# Patient Record
Sex: Female | Born: 1980 | Race: White | Hispanic: No | Marital: Married | Smoking: Never smoker
Health system: Southern US, Community
[De-identification: ages and names within clinical notes are randomized; demographics above are authoritative.]

## PROBLEM LIST (undated history)

## (undated) DIAGNOSIS — D649 Anemia, unspecified: Secondary | ICD-10-CM

## (undated) DIAGNOSIS — M1612 Unilateral primary osteoarthritis, left hip: Secondary | ICD-10-CM

## (undated) DIAGNOSIS — R112 Nausea with vomiting, unspecified: Secondary | ICD-10-CM

## (undated) DIAGNOSIS — F419 Anxiety disorder, unspecified: Secondary | ICD-10-CM

## (undated) DIAGNOSIS — H8109 Meniere's disease, unspecified ear: Secondary | ICD-10-CM

## (undated) DIAGNOSIS — E282 Polycystic ovarian syndrome: Secondary | ICD-10-CM

## (undated) DIAGNOSIS — G43909 Migraine, unspecified, not intractable, without status migrainosus: Secondary | ICD-10-CM

## (undated) DIAGNOSIS — Z9889 Other specified postprocedural states: Secondary | ICD-10-CM

## (undated) DIAGNOSIS — D6851 Activated protein C resistance: Secondary | ICD-10-CM

## (undated) DIAGNOSIS — B029 Zoster without complications: Secondary | ICD-10-CM

## (undated) DIAGNOSIS — M479 Spondylosis, unspecified: Secondary | ICD-10-CM

## (undated) DIAGNOSIS — N809 Endometriosis, unspecified: Secondary | ICD-10-CM

## (undated) DIAGNOSIS — E069 Thyroiditis, unspecified: Secondary | ICD-10-CM

## (undated) HISTORY — DX: Endometriosis, unspecified: N80.9

## (undated) HISTORY — PX: NASAL SINUS SURGERY: SHX719

## (undated) HISTORY — PX: PELVIC LAPAROSCOPY: SHX162

## (undated) HISTORY — DX: Meniere's disease, unspecified ear: H81.09

## (undated) HISTORY — DX: Migraine, unspecified, not intractable, without status migrainosus: G43.909

## (undated) HISTORY — DX: Activated protein C resistance: D68.51

## (undated) HISTORY — DX: Polycystic ovarian syndrome: E28.2

## (undated) HISTORY — PX: SMALL INTESTINE SURGERY: SHX150

## (undated) HISTORY — DX: Anxiety disorder, unspecified: F41.9

---
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
## 2001-07-12 HISTORY — PX: NASAL SINUS SURGERY: SHX719

## 2006-07-12 HISTORY — PX: HERNIA REPAIR: SHX51

## 2006-07-12 HISTORY — PX: ABDOMINOPLASTY: SUR9

## 2012-07-12 DIAGNOSIS — I471 Supraventricular tachycardia, unspecified: Secondary | ICD-10-CM

## 2012-07-12 HISTORY — DX: Supraventricular tachycardia: I47.1

## 2012-07-12 HISTORY — DX: Supraventricular tachycardia, unspecified: I47.10

## 2012-07-12 HISTORY — PX: CARDIAC ELECTROPHYSIOLOGY STUDY AND ABLATION: SHX1294

## 2014-05-21 DIAGNOSIS — M5030 Other cervical disc degeneration, unspecified cervical region: Secondary | ICD-10-CM | POA: Insufficient documentation

## 2014-05-21 DIAGNOSIS — G43909 Migraine, unspecified, not intractable, without status migrainosus: Secondary | ICD-10-CM | POA: Insufficient documentation

## 2014-05-21 DIAGNOSIS — M4317 Spondylolisthesis, lumbosacral region: Secondary | ICD-10-CM | POA: Insufficient documentation

## 2014-06-17 DIAGNOSIS — G4700 Insomnia, unspecified: Secondary | ICD-10-CM | POA: Insufficient documentation

## 2014-06-17 DIAGNOSIS — R0683 Snoring: Secondary | ICD-10-CM | POA: Insufficient documentation

## 2016-07-12 HISTORY — PX: PELVIC LAPAROSCOPY: SHX162

## 2017-02-15 DIAGNOSIS — R42 Dizziness and giddiness: Secondary | ICD-10-CM | POA: Insufficient documentation

## 2017-02-15 DIAGNOSIS — H905 Unspecified sensorineural hearing loss: Secondary | ICD-10-CM | POA: Insufficient documentation

## 2017-02-15 DIAGNOSIS — H6981 Other specified disorders of Eustachian tube, right ear: Secondary | ICD-10-CM | POA: Insufficient documentation

## 2017-02-15 DIAGNOSIS — H8190 Unspecified disorder of vestibular function, unspecified ear: Secondary | ICD-10-CM | POA: Insufficient documentation

## 2017-02-15 DIAGNOSIS — H6991 Unspecified Eustachian tube disorder, right ear: Secondary | ICD-10-CM | POA: Insufficient documentation

## 2017-02-15 DIAGNOSIS — J3081 Allergic rhinitis due to animal (cat) (dog) hair and dander: Secondary | ICD-10-CM | POA: Insufficient documentation

## 2017-02-15 DIAGNOSIS — H903 Sensorineural hearing loss, bilateral: Secondary | ICD-10-CM | POA: Insufficient documentation

## 2017-06-14 DIAGNOSIS — H8101 Meniere's disease, right ear: Secondary | ICD-10-CM | POA: Insufficient documentation

## 2017-12-01 ENCOUNTER — Other Ambulatory Visit: Payer: Self-pay | Admitting: Sports Medicine

## 2017-12-01 DIAGNOSIS — M4317 Spondylolisthesis, lumbosacral region: Secondary | ICD-10-CM

## 2017-12-01 DIAGNOSIS — M79604 Pain in right leg: Secondary | ICD-10-CM

## 2017-12-01 DIAGNOSIS — M545 Low back pain: Secondary | ICD-10-CM

## 2017-12-01 DIAGNOSIS — M4727 Other spondylosis with radiculopathy, lumbosacral region: Secondary | ICD-10-CM

## 2017-12-14 ENCOUNTER — Ambulatory Visit: Payer: Self-pay

## 2017-12-23 ENCOUNTER — Ambulatory Visit
Admission: RE | Admit: 2017-12-23 | Discharge: 2017-12-23 | Disposition: A | Discharge status: Home / Self Care | Payer: 59 | Source: Ambulatory Visit | Attending: Sports Medicine | Admitting: Sports Medicine

## 2017-12-23 ENCOUNTER — Encounter: Payer: Self-pay | Admitting: Radiology

## 2017-12-23 DIAGNOSIS — M4807 Spinal stenosis, lumbosacral region: Secondary | ICD-10-CM | POA: Insufficient documentation

## 2017-12-23 DIAGNOSIS — M48061 Spinal stenosis, lumbar region without neurogenic claudication: Secondary | ICD-10-CM | POA: Diagnosis not present

## 2017-12-23 DIAGNOSIS — M4317 Spondylolisthesis, lumbosacral region: Secondary | ICD-10-CM | POA: Insufficient documentation

## 2017-12-23 DIAGNOSIS — M545 Low back pain: Secondary | ICD-10-CM | POA: Diagnosis present

## 2017-12-23 DIAGNOSIS — M4727 Other spondylosis with radiculopathy, lumbosacral region: Secondary | ICD-10-CM | POA: Diagnosis not present

## 2017-12-23 DIAGNOSIS — M5126 Other intervertebral disc displacement, lumbar region: Secondary | ICD-10-CM | POA: Insufficient documentation

## 2017-12-23 DIAGNOSIS — M5127 Other intervertebral disc displacement, lumbosacral region: Secondary | ICD-10-CM | POA: Insufficient documentation

## 2017-12-23 DIAGNOSIS — M79604 Pain in right leg: Secondary | ICD-10-CM

## 2018-01-10 ENCOUNTER — Encounter: Payer: Self-pay | Admitting: Certified Nurse Midwife

## 2018-01-10 ENCOUNTER — Ambulatory Visit (INDEPENDENT_AMBULATORY_CARE_PROVIDER_SITE_OTHER): Payer: Managed Care, Other (non HMO) | Admitting: Certified Nurse Midwife

## 2018-01-10 VITALS — BP 116/66 | HR 93 | Ht 71.0 in | Wt 204.5 lb

## 2018-01-10 DIAGNOSIS — Z30430 Encounter for insertion of intrauterine contraceptive device: Secondary | ICD-10-CM

## 2018-01-10 DIAGNOSIS — N809 Endometriosis, unspecified: Secondary | ICD-10-CM

## 2018-01-10 DIAGNOSIS — N921 Excessive and frequent menstruation with irregular cycle: Secondary | ICD-10-CM | POA: Diagnosis not present

## 2018-01-10 DIAGNOSIS — Z3202 Encounter for pregnancy test, result negative: Secondary | ICD-10-CM

## 2018-01-10 LAB — POCT URINE PREGNANCY: PREG TEST UR: NEGATIVE

## 2018-01-10 NOTE — Progress Notes (Signed)
New pt is here with issues with endometriosis. Mirena removed 1.5 years ago due to wanting to have another baby. They have decided to not try. Refused ibuprofen. Mirena IUD consent signed.

## 2018-01-10 NOTE — Patient Instructions (Addendum)
IUD PLACEMENT POST-PROCEDURE INSTRUCTIONS  1. You may take Ibuprofen, Aleve or Tylenol for pain if needed.  Cramping should resolve within in 24 hours.  2. You may have a small amount of spotting.  You should wear a mini pad for the next few days.  3. You may have intercourse after 72 hours.  If you using this for birth control, it is effective immediately.  4. You need to call if you have any pelvic pain, fever, heavy bleeding or foul smelling vaginal discharge.  Irregular bleeding is common the first several months after having an IUD placed. You do not need to call for this reason unless you are concerned.  5. Shower or bathe as normal  You should have a follow-up appointment in 4-8 weeks for a re-check to make sure you are not having any problems.  Levonorgestrel intrauterine device (IUD) What is this medicine? LEVONORGESTREL IUD (LEE voe nor jes trel) is a contraceptive (birth control) device. The device is placed inside the uterus by a healthcare professional. It is used to prevent pregnancy. This device can also be used to treat heavy bleeding that occurs during your period. This medicine may be used for other purposes; ask your health care provider or pharmacist if you have questions. COMMON BRAND NAME(S): Minette Headland What should I tell my health care provider before I take this medicine? They need to know if you have any of these conditions: -abnormal Pap smear -cancer of the breast, uterus, or cervix -diabetes -endometritis -genital or pelvic infection now or in the past -have more than one sexual partner or your partner has more than one partner -heart disease -history of an ectopic or tubal pregnancy -immune system problems -IUD in place -liver disease or tumor -problems with blood clots or take blood-thinners -seizures -use intravenous drugs -uterus of unusual shape -vaginal bleeding that has not been explained -an unusual or allergic reaction to  levonorgestrel, other hormones, silicone, or polyethylene, medicines, foods, dyes, or preservatives -pregnant or trying to get pregnant -breast-feeding How should I use this medicine? This device is placed inside the uterus by a health care professional. Talk to your pediatrician regarding the use of this medicine in children. Special care may be needed. Overdosage: If you think you have taken too much of this medicine contact a poison control center or emergency room at once. NOTE: This medicine is only for you. Do not share this medicine with others. What if I miss a dose? This does not apply. Depending on the brand of device you have inserted, the device will need to be replaced every 3 to 5 years if you wish to continue using this type of birth control. What may interact with this medicine? Do not take this medicine with any of the following medications: -amprenavir -bosentan -fosamprenavir This medicine may also interact with the following medications: -aprepitant -armodafinil -barbiturate medicines for inducing sleep or treating seizures -bexarotene -boceprevir -griseofulvin -medicines to treat seizures like carbamazepine, ethotoin, felbamate, oxcarbazepine, phenytoin, topiramate -modafinil -pioglitazone -rifabutin -rifampin -rifapentine -some medicines to treat HIV infection like atazanavir, efavirenz, indinavir, lopinavir, nelfinavir, tipranavir, ritonavir -St. John's wort -warfarin This list may not describe all possible interactions. Give your health care provider a list of all the medicines, herbs, non-prescription drugs, or dietary supplements you use. Also tell them if you smoke, drink alcohol, or use illegal drugs. Some items may interact with your medicine. What should I watch for while using this medicine? Visit your doctor or health care professional  for regular check ups. See your doctor if you or your partner has sexual contact with others, becomes HIV positive, or  gets a sexual transmitted disease. This product does not protect you against HIV infection (AIDS) or other sexually transmitted diseases. You can check the placement of the IUD yourself by reaching up to the top of your vagina with clean fingers to feel the threads. Do not pull on the threads. It is a good habit to check placement after each menstrual period. Call your doctor right away if you feel more of the IUD than just the threads or if you cannot feel the threads at all. The IUD may come out by itself. You may become pregnant if the device comes out. If you notice that the IUD has come out use a backup birth control method like condoms and call your health care provider. Using tampons will not change the position of the IUD and are okay to use during your period. This IUD can be safely scanned with magnetic resonance imaging (MRI) only under specific conditions. Before you have an MRI, tell your healthcare provider that you have an IUD in place, and which type of IUD you have in place. What side effects may I notice from receiving this medicine? Side effects that you should report to your doctor or health care professional as soon as possible: -allergic reactions like skin rash, itching or hives, swelling of the face, lips, or tongue -fever, flu-like symptoms -genital sores -high blood pressure -no menstrual period for 6 weeks during use -pain, swelling, warmth in the leg -pelvic pain or tenderness -severe or sudden headache -signs of pregnancy -stomach cramping -sudden shortness of breath -trouble with balance, talking, or walking -unusual vaginal bleeding, discharge -yellowing of the eyes or skin Side effects that usually do not require medical attention (report to your doctor or health care professional if they continue or are bothersome): -acne -breast pain -change in sex drive or performance -changes in weight -cramping, dizziness, or faintness while the device is being  inserted -headache -irregular menstrual bleeding within first 3 to 6 months of use -nausea This list may not describe all possible side effects. Call your doctor for medical advice about side effects. You may report side effects to FDA at 1-800-FDA-1088. Where should I keep my medicine? This does not apply. NOTE: This sheet is a summary. It may not cover all possible information. If you have questions about this medicine, talk to your doctor, pharmacist, or health care provider.  2018 Elsevier/Gold Standard (2016-04-09 14:14:56)  Endometriosis Endometriosis is a condition in which the tissue that lines the uterus (endometrium) grows outside of its normal location. The tissue may grow in many locations close to the uterus, but it commonly grows on the ovaries, fallopian tubes, vagina, or bowel. When the uterus sheds the endometrium every menstrual cycle, there is bleeding wherever the endometrial tissue is located. This can cause pain because blood is irritating to tissues that are not normally exposed to it. What are the causes? The cause of endometriosis is not known. What increases the risk? You may be more likely to develop endometriosis if you:  Have a family history of endometriosis.  Have never given birth.  Started your period at age 61 or younger.  Have high levels of estrogen in your body.  Were exposed to a certain medicine (diethylstilbestrol) before you were born (in utero).  Had low birth weight.  Were born as a twin, triplet, or other multiple.  Have  a BMI of less than 25. BMI is an estimate of body fat and is calculated from height and weight.  What are the signs or symptoms? Often, there are no symptoms of this condition. If you do have symptoms, they may:  Vary depending on where your endometrial tissue is growing.  Occur during your menstrual period (most common) or midcycle.  Come and go, or you may go months with no symptoms at all.  Stop with  menopause.  Symptoms may include:  Pain in the back or abdomen.  Heavier bleeding during periods.  Pain during sex.  Painful bowel movements.  Infertility.  Pelvic pain.  Bleeding more than once a month.  How is this diagnosed? This condition is diagnosed based on your symptoms and a physical exam. You may have tests, such as:  Blood tests and urine tests. These may be done to help rule out other possible causes of your symptoms.  Ultrasound, to look for abnormal tissues.  An X-ray of the lower bowel (barium enema).  An ultrasound that is done through the vagina (transvaginally).  CT scan.  MRI.  Laparoscopy. In this procedure, a lighted, pencil-sized instrument called a laparoscope is inserted into your abdomen through an incision. The laparoscope allows your health care provider to look at the organs inside your body and check for abnormal tissue to confirm the diagnosis. If abnormal tissue is found, your health care provider may remove a small piece of tissue (biopsy) to be examined under a microscope.  How is this treated? Treatment for this condition may include:  Medicines to relieve pain, such as NSAIDs.  Hormone therapy. This involves using artificial (synthetic) hormones to reduce endometrial tissue growth. Your health care provider may recommend using a hormonal form of birth control, or other medicines.  Surgery. This may be done to remove abnormal endometrial tissue. ? In some cases, tissue may be removed using a laparoscope and a laser (laparoscopic laser treatment). ? In severe cases, surgery may be done to remove the fallopian tubes, uterus, and ovaries (hysterectomy).  Follow these instructions at home:  Take over-the-counter and prescription medicines only as told by your health care provider.  Do not drive or use heavy machinery while taking prescription pain medicine.  Try to avoid activities that cause pain, including sexual activity.  Keep  all follow-up visits as told by your health care provider. This is important. Contact a health care provider if:  You have pain in the area between your hip bones (pelvic area) that occurs: ? Before, during, or after your period. ? In between your period and gets worse during your period. ? During or after sex. ? With bowel movements or urination, especially during your period.  You have problems getting pregnant.  You have a fever. Get help right away if:  You have severe pain that does not get better with medicine.  You have severe nausea and vomiting, or you cannot eat without vomiting.  You have pain that affects only the lower, right side of your abdomen.  You have abdominal pain that gets worse.  You have abdominal swelling.  You have blood in your stool. This information is not intended to replace advice given to you by your health care provider. Make sure you discuss any questions you have with your health care provider. Document Released: 06/25/2000 Document Revised: 04/02/2016 Document Reviewed: 11/29/2015 Elsevier Interactive Patient Education  [employer]2018 Elsevier Inc.

## 2018-01-10 NOTE — Progress Notes (Signed)
GYN ENCOUNTER NOTE  Subjective:       Amber Reed is a 37 y.o. G1P1 female here to discuss endometriosis treatment options. Symptoms previously managed with Mirena, but removed approximately one (1) year ago due to side effects (skin, hair, and mood changes) and decision to grow family.   At this time, the patient no longer desires any additional children and questions the option of hysterectomy in the future. States her mother had a hysterectomy in her 2640s.   Denies difficulty breathing or respiratory distress, chest pain, dysuria, and leg pain or swelling.    Gynecologic History  Patient's last menstrual period was 12/26/2017 (exact date).  Period Duration (Days): Seven (7) to 10 Period Pattern: (!) Irregular Menstrual Flow: Moderate Menstrual Control: Tampon, Maxi pad Menstrual Control Change Freq (Hours): Three (3) to four (4) Dysmenorrhea: (!) Severe Dysmenorrhea Symptoms: Cramping, Other (Comment)(Back pain)  Contraception: none  Last Pap: 2017. Results were: Negative/Negative   Obstetric History  OB History  Gravida Para Term Preterm AB Living  1 1          SAB TAB Ectopic Multiple Live Births          1    # Outcome Date GA Lbr Len/2nd Weight Sex Delivery Anes PTL Lv  1 Para             Past Medical History:  Diagnosis Date  . Anxiety   . Endometriosis   . Factor 5 Leiden mutation, heterozygous (HCC)   . Meniere disease   . Migraine   . PCO (polycystic ovaries)      Current Outpatient Medications on File Prior to Visit  Medication Sig Dispense Refill  . azelastine (ASTELIN) 0.1 % nasal spray Place into the nose.    Marland Kitchen. Betahistine HCl (BETAHISTINE DIHYDROCHLORIDE) POWD Use 24 mg once daily    . Cyanocobalamin-Methylcobalamin 600-600 MCG SUBL Place under the tongue.    . diclofenac (CATAFLAM) 50 MG tablet diclofenac potassium 50 mg tablet  TAKE 1 TABLET BY MOUTH EVERY 6 HOURS as needed    . gabapentin (NEURONTIN) 300 MG capsule Take by mouth.    .  ondansetron (ZOFRAN-ODT) 8 MG disintegrating tablet     . vitamin E 600 UNIT capsule Take by mouth.     No current facility-administered medications on file prior to visit.     Social History   Socioeconomic History  . Marital status: Married    Spouse name: Not on file  . Number of children: Not on file  . Years of education: Not on file  . Highest education level: Not on file  Occupational History  . Not on file  Social Needs  . Financial resource strain: Not on file  . Food insecurity:    Worry: Not on file    Inability: Not on file  . Transportation needs:    Medical: Not on file    Non-medical: Not on file  Tobacco Use  . Smoking status: Never Smoker  . Smokeless tobacco: Never Used  Substance and Sexual Activity  . Alcohol use: Not on file    Comment: ocass  . Drug use: Not Currently  . Sexual activity: Yes    Birth control/protection: None  Lifestyle  . Physical activity:    Days per week: Not on file    Minutes per session: Not on file  . Stress: Not on file  Relationships  . Social connections:    Talks on phone: Not on file  Gets together: Not on file    Attends religious service: Not on file    Active member of club or organization: Not on file    Attends meetings of clubs or organizations: Not on file    Relationship status: Not on file  . Intimate partner violence:    Fear of current or ex partner: Not on file    Emotionally abused: Not on file    Physically abused: Not on file    Forced sexual activity: Not on file  Other Topics Concern  . Not on file  Social History Narrative  . Not on file    Family History  Problem Relation Age of Onset  . Diabetes Mother   . Seizures Paternal Aunt   . Cancer Maternal Grandmother   . Stroke Paternal Grandmother     The following portions of the patient's history were reviewed and updated as appropriate: allergies, current medications, past family history, past medical history, past social history,  past surgical history and problem list.  Review of Systems  ROS negative except as noted above. Information obtained from patient.   Objective:   BP 116/66   Pulse 93   Ht 5\' 11"  (1.803 m)   Wt 204 lb 8 oz (92.8 kg)   LMP 12/26/2017 (Exact Date)   BMI 28.52 kg/m    CONSTITUTIONAL: Well-developed, well-nourished female in no acute distress.   ABDOMEN: Soft, non distended; Non tender.  No Organomegaly.  PELVIC:  External Genitalia: Normal  Vagina: Normal  Cervix: Normal  Uterus: Normal size, shape,consistency, mobile  Adnexa: Normal  MUSCULOSKELETAL: Normal range of motion. No tenderness.  No cyanosis, clubbing, or edema.  Assessment:   1. Encounter for insertion of mirena IUD  - POCT urine pregnancy   2. Endometriosis  3. Menorrhagia with irregular cycle  Plan:   Discussed all treatment options. Patient would like to proceed with Mirena insertion today and follow up with MD to discussed surgical options.   Mirena inserted, see note below.   RTC x 4-6 weeks for IUD string check and surgical options discussion with MD or sooner if needed.    Gunnar Bulla, CNM [facility], [facility]  Mirena Insertion Note:  Amber Reed is a 37 y.o. year old G1P1 Caucasian female who presents for placement of a Mirena IUD.  Patient's last menstrual period was 12/26/2017 (exact date). BP 116/66   Pulse 93   Ht 5\' 11"  (1.803 m)   Wt 204 lb 8 oz (92.8 kg)   LMP 12/26/2017 (Exact Date)   BMI 28.52 kg/m    Last sexual intercourse was two (2) weeks ago, and pregnancy test today was negative.  The risks and benefits of the method and placement have been thouroughly reviewed with the patient and all questions were answered.  Specifically the patient is aware of failure rate of 07/998, expulsion of the IUD and of possible perforation.  The patient is aware of irregular bleeding due to the method and understands the incidence of irregular bleeding diminishes  with time.  Signed copy of informed consent in chart.   Time out was performed.  A medium plastic speculum was placed in the vagina.  The cervix was visualized, prepped using Betadine, and grasped with a single tooth tenaculum. The uterus was found to be retroflexed and it sounded to 8 cm.  Mirena IUD placed per manufacturer's recommendations.   The strings were trimmed to 3 cm.  The patient was given post procedure instructions, including  signs and symptoms of infection and to check for the strings after each menses or each month, and refraining from intercourse or anything in the vagina for 3 days.  She was given a Mirena care card with date Mirena placed, and date Mirena to be removed.  Reviewed red flag symptoms and when to call.   RTC x 4-6 weeks for IUD string check.    Gunnar Bulla, CNM [facility], [facility]

## 2018-02-07 ENCOUNTER — Encounter: Payer: Managed Care, Other (non HMO) | Admitting: Certified Nurse Midwife

## 2018-02-15 ENCOUNTER — Encounter: Payer: Managed Care, Other (non HMO) | Admitting: Obstetrics and Gynecology

## 2018-02-16 ENCOUNTER — Encounter: Payer: Managed Care, Other (non HMO) | Admitting: Certified Nurse Midwife

## 2018-02-23 ENCOUNTER — Encounter: Payer: Self-pay | Admitting: Obstetrics and Gynecology

## 2018-02-23 ENCOUNTER — Ambulatory Visit (INDEPENDENT_AMBULATORY_CARE_PROVIDER_SITE_OTHER): Payer: Managed Care, Other (non HMO) | Admitting: Obstetrics and Gynecology

## 2018-02-23 VITALS — BP 131/89 | HR 105 | Ht 71.0 in | Wt 203.0 lb

## 2018-02-23 DIAGNOSIS — N809 Endometriosis, unspecified: Secondary | ICD-10-CM | POA: Diagnosis not present

## 2018-02-23 DIAGNOSIS — Z30431 Encounter for routine checking of intrauterine contraceptive device: Secondary | ICD-10-CM

## 2018-02-23 NOTE — Progress Notes (Signed)
Pt states no issues with IUD. Pt also states she is experiencing severe lower back pain that radiates down her legs at the start of her menstrual cycle.

## 2018-02-23 NOTE — Progress Notes (Signed)
HPI:      [name] is a 37 y.o. G1P1 who LMP was Patient's last menstrual period was 01/25/2018.  Subjective:   She presents today for discussion of endometriosis and IUD string check.  Patient recently had an IUD placed for birth control as well as control of endometriosis.  (Surgically diagnosed) the patient also has a family history, her mother had endometriosis and underwent hysterectomy at a young age for this problem.  The patient has been experiencing cyclic back pain but admittedly she has a "bulging disc" for which she receives regular injections.  She previously had Mirena IUD and she was amenorrheic on it. She is wondering about the possible timing for surgery and how she would decide "when she needs surgery".    Hx: The following portions of the patient's history were reviewed and updated as appropriate:             She  has a past medical history of Anxiety, Endometriosis, Factor 5 Leiden mutation, heterozygous (HCC), Meniere disease, Migraine, and PCO (polycystic ovaries). She does not have any pertinent problems on file. She  has a past surgical history that includes Cesarean section; Nasal sinus surgery; and Pelvic laparoscopy. Her family history includes Cancer in her maternal grandmother; Diabetes in her mother; Seizures in her paternal aunt; Stroke in her paternal grandmother. She  reports that she has never smoked. She has never used smokeless tobacco. She reports that she has current or past drug history. Her alcohol history is not on file. She has a current medication list which includes the following prescription(s): azelastine, betahistine dihydrochloride, cyanocobalamin-methylcobalamin, diclofenac, ondansetron, vitamin e, and gabapentin. She has no allergies on file.       Review of Systems:  Review of Systems  Constitutional: Denied constitutional symptoms, night sweats, recent illness, fatigue, fever, insomnia and weight loss.  Eyes: Denied eye symptoms,  eye pain, photophobia, vision change and visual disturbance.  Ears/Nose/Throat/Neck: Denied ear, nose, throat or neck symptoms, hearing loss, nasal discharge, sinus congestion and sore throat.  Cardiovascular: Denied cardiovascular symptoms, arrhythmia, chest pain/pressure, edema, exercise intolerance, orthopnea and palpitations.  Respiratory: Denied pulmonary symptoms, asthma, pleuritic pain, productive sputum, cough, dyspnea and wheezing.  Gastrointestinal: Denied, gastro-esophageal reflux, melena, nausea and vomiting.  Genitourinary: Denied genitourinary symptoms including symptomatic vaginal discharge, pelvic relaxation issues, and urinary complaints.  Musculoskeletal: Denied musculoskeletal symptoms, stiffness, swelling, muscle weakness and myalgia.  Dermatologic: Denied dermatology symptoms, rash and scar.  Neurologic: Denied neurology symptoms, dizziness, headache, neck pain and syncope.  Psychiatric: Denied psychiatric symptoms, anxiety and depression.  Endocrine: Denied endocrine symptoms including hot flashes and night sweats.   Meds:   Current Outpatient Medications on File Prior to Visit  Medication Sig Dispense Refill  . azelastine (ASTELIN) 0.1 % nasal spray Place into the nose.    Marland Kitchen. Betahistine HCl (BETAHISTINE DIHYDROCHLORIDE) POWD Use 24 mg once daily    . Cyanocobalamin-Methylcobalamin 600-600 MCG SUBL Place under the tongue.    . diclofenac (CATAFLAM) 50 MG tablet diclofenac potassium 50 mg tablet  TAKE 1 TABLET BY MOUTH EVERY 6 HOURS as needed    . ondansetron (ZOFRAN-ODT) 8 MG disintegrating tablet     . vitamin E 600 UNIT capsule Take by mouth.    . gabapentin (NEURONTIN) 300 MG capsule Take by mouth.     No current facility-administered medications on file prior to visit.     Objective:     Vitals:   02/23/18 1429  BP: 131/89  Pulse: Marland Kitchen(!)  105              Physical examination   Pelvic:   Vulva: Normal appearance.  No lesions.  Vagina: No lesions or  abnormalities noted.  Support: Normal pelvic support.  Urethra No masses tenderness or scarring.  Meatus Normal size without lesions or prolapse.  Cervix: Normal appearance.  No lesions. IUD strings noted at cervical os.  And shortened  Anus: Normal exam.  No lesions.  Perineum: Normal exam.  No lesions.        Bimanual   Uterus: Normal size.  Non-tender.  Mobile.  AV.  Adnexae: No masses.  Non-tender to palpation.  Cul-de-sac: Negative for abnormality.     Assessment:    G1P1 Patient Active Problem List   Diagnosis Date Noted  . Endometriosis 01/10/2018     1. Surveillance of previously prescribed intrauterine contraceptive device   2. Endometriosis     Patient has cyclic back pain that is not disabling but does sound like it impacts her life.  She is not sure if this is from endometriosis from a known orthopedic problem. She has now had the IUD approximately 4 weeks and she has just had a small amount of spotting in the last few days.  She is expecting amenorrhea.   Plan:            1.  Expectant management of IUD and endometriosis.  She may achieve significant relief of pain over the next few months if it is caused by endometriosis.  Should her back pain continue and musculoskeletal issues are ruled out would consider surgical options for the endometriosis.  We had a long discussion regarding her knowledge of her pain and her desire for surgery.  At this time she does not want surgery.  I informed her that if the pain became bad enough she would tell me when surgery was appropriate.  Orders No orders of the defined types were placed in this encounter.   No orders of the defined types were placed in this encounter.     F/U  Return for Pt to contact us if symptoms worsen. I spent 19 minutes involved in the care of this patient of which greater than 50% was spent discussing endometriosis, patient's history, family history, IUD and its effect on endometriosis, amenorrhea and  IUD, possible future surgery or management of endometriosis.  Hormonal effects of oophorectomy and the possible effect on endometriosis.  All questions answered.  Elonda Huskyavid J. Katie Moch, M.D. 02/23/2018 3:04 PM

## 2018-05-02 ENCOUNTER — Other Ambulatory Visit (HOSPITAL_COMMUNITY): Payer: Self-pay | Admitting: Neurological Surgery

## 2018-05-02 DIAGNOSIS — M25552 Pain in left hip: Secondary | ICD-10-CM

## 2018-05-17 ENCOUNTER — Other Ambulatory Visit: Payer: Self-pay | Admitting: Neurological Surgery

## 2018-05-17 DIAGNOSIS — M25552 Pain in left hip: Secondary | ICD-10-CM

## 2018-06-05 ENCOUNTER — Encounter: Payer: Self-pay | Admitting: Radiology

## 2018-06-05 ENCOUNTER — Ambulatory Visit
Admission: RE | Admit: 2018-06-05 | Discharge: 2018-06-05 | Disposition: A | Discharge status: Home / Self Care | Payer: 59 | Source: Ambulatory Visit | Attending: Neurological Surgery | Admitting: Neurological Surgery

## 2018-06-05 DIAGNOSIS — M25552 Pain in left hip: Secondary | ICD-10-CM

## 2018-06-05 DIAGNOSIS — M5136 Other intervertebral disc degeneration, lumbar region: Secondary | ICD-10-CM | POA: Insufficient documentation

## 2018-06-05 MED ORDER — SODIUM CHLORIDE (PF) 0.9 % IJ SOLN
10.0000 mL | INTRAMUSCULAR | Status: DC | PRN
  Administered 2018-06-05: 5 mL

## 2018-06-05 MED ORDER — LIDOCAINE HCL (PF) 1 % IJ SOLN
30.0000 mL | Freq: Once | INTRAMUSCULAR | Status: AC
  Administered 2018-06-05: 10 mL
  Filled 2018-06-05: qty 30

## 2018-06-05 MED ORDER — IOPAMIDOL (ISOVUE-300) INJECTION 61%
50.0000 mL | Freq: Once | INTRAVENOUS | Status: AC | PRN
  Administered 2018-06-05: 15 mL

## 2018-06-05 MED ORDER — GADOBUTROL 1 MMOL/ML IV SOLN
1.0000 mL | Freq: Once | INTRAVENOUS | Status: AC | PRN
  Administered 2018-06-05: 0.05 mL

## 2018-06-29 ENCOUNTER — Other Ambulatory Visit: Payer: Self-pay

## 2018-06-29 ENCOUNTER — Encounter: Payer: Self-pay | Admitting: Nurse Practitioner

## 2018-06-29 ENCOUNTER — Ambulatory Visit: Payer: Managed Care, Other (non HMO) | Attending: Nurse Practitioner | Admitting: Nurse Practitioner

## 2018-06-29 VITALS — BP 117/70 | HR 96 | Temp 98.3°F | Ht 70.0 in | Wt 195.0 lb

## 2018-06-29 DIAGNOSIS — N760 Acute vaginitis: Secondary | ICD-10-CM | POA: Insufficient documentation

## 2018-06-29 DIAGNOSIS — F329 Major depressive disorder, single episode, unspecified: Secondary | ICD-10-CM | POA: Insufficient documentation

## 2018-06-29 DIAGNOSIS — R1032 Left lower quadrant pain: Secondary | ICD-10-CM

## 2018-06-29 DIAGNOSIS — H905 Unspecified sensorineural hearing loss: Secondary | ICD-10-CM | POA: Insufficient documentation

## 2018-06-29 DIAGNOSIS — M79605 Pain in left leg: Secondary | ICD-10-CM

## 2018-06-29 DIAGNOSIS — M533 Sacrococcygeal disorders, not elsewhere classified: Secondary | ICD-10-CM | POA: Diagnosis not present

## 2018-06-29 DIAGNOSIS — M25552 Pain in left hip: Secondary | ICD-10-CM | POA: Diagnosis not present

## 2018-06-29 DIAGNOSIS — Z832 Family history of diseases of the blood and blood-forming organs and certain disorders involving the immune mechanism: Secondary | ICD-10-CM | POA: Insufficient documentation

## 2018-06-29 DIAGNOSIS — Z79899 Other long term (current) drug therapy: Secondary | ICD-10-CM | POA: Diagnosis not present

## 2018-06-29 DIAGNOSIS — G8929 Other chronic pain: Secondary | ICD-10-CM

## 2018-06-29 DIAGNOSIS — M899 Disorder of bone, unspecified: Secondary | ICD-10-CM

## 2018-06-29 DIAGNOSIS — M5441 Lumbago with sciatica, right side: Secondary | ICD-10-CM

## 2018-06-29 DIAGNOSIS — M5442 Lumbago with sciatica, left side: Secondary | ICD-10-CM | POA: Diagnosis not present

## 2018-06-29 DIAGNOSIS — M5030 Other cervical disc degeneration, unspecified cervical region: Secondary | ICD-10-CM | POA: Insufficient documentation

## 2018-06-29 DIAGNOSIS — F411 Generalized anxiety disorder: Secondary | ICD-10-CM | POA: Insufficient documentation

## 2018-06-29 DIAGNOSIS — G894 Chronic pain syndrome: Secondary | ICD-10-CM | POA: Diagnosis not present

## 2018-06-29 DIAGNOSIS — M79604 Pain in right leg: Secondary | ICD-10-CM

## 2018-06-29 DIAGNOSIS — M4317 Spondylolisthesis, lumbosacral region: Secondary | ICD-10-CM | POA: Insufficient documentation

## 2018-06-29 DIAGNOSIS — Z789 Other specified health status: Secondary | ICD-10-CM

## 2018-06-29 DIAGNOSIS — M479 Spondylosis, unspecified: Secondary | ICD-10-CM | POA: Insufficient documentation

## 2018-06-29 DIAGNOSIS — R1030 Lower abdominal pain, unspecified: Secondary | ICD-10-CM | POA: Insufficient documentation

## 2018-06-29 DIAGNOSIS — A6920 Lyme disease, unspecified: Secondary | ICD-10-CM | POA: Insufficient documentation

## 2018-06-29 NOTE — Progress Notes (Signed)
Patient's Name: Amber Reed  MRN: [id number]  Referring Provider: Kathlene November, MD  DOB: 1981/06/24  PCP: Elisabeth Most, MD  DOS: 06/29/2018  Note by: Dionisio David NP  Service setting: Ambulatory outpatient  Specialty: Interventional Pain Management  Location: [facility] (AMB) Pain Management Facility    Patient type: New Patient    Primary Reason(s) for Visit: Initial Patient Evaluation CC: Groin Pain  HPI  Amber Reed is a 37 y.o. year old, female patient, who comes today for an initial evaluation. She has Endometriosis; Allergic rhinitis due to animal hair and dander; Degenerative disc disease, cervical; Dysfunction of right eustachian tube; Family history of factor V Leiden mutation; GAD (generalized anxiety disorder); Hearing loss, sensorineural, asymmetrical; Insomnia; Lyme disease; Major depression; Meniere's disease of right ear; Migraines; Snoring; Spondylolisthesis at L5-S1 level; Spondylosis; Vaginal infection; Vestibular dizziness; Groin pain, chronic, left (Primary Area of Pain); Chronic hip pain, left (Secondary Area of Pain); Chronic left sacroiliac joint pain (Tertiary Area of Pain); Chronic bilateral low back pain with bilateral sciatica (Fourth Area of Pain) (L>R); Chronic pain of both lower extremities (L>R); Chronic pain syndrome; Pharmacologic therapy; Disorder of skeletal system; and Problems influencing health status on their problem list.. Her primarily concern today is the Groin Pain  Pain Assessment: Location: Left Groin(left hip, buttock) Radiating: numbness run down the back of my calf Onset: More than a month ago Duration: Chronic pain Quality: Dull, Numbness, Discomfort, Constant(worse at night) Severity: 5 /10 (subjective, self-reported pain score)  Note: Reported level is compatible with observation. Clinically the patient looks like a 1/10 A 1/10 is viewed as "Mild" and described as nagging, annoying, but not interfering with basic activities of daily  living (ADL). Amber Reed is able to eat, bathe, get dressed, do toileting (being able to get on and off the toilet and perform personal hygiene functions), transfer (move in and out of bed or a chair without assistance), and maintain continence (able to control bladder and bowel functions). Physiologic parameters such as blood pressure and heart rate apear wnl. Information on the proper use of the pain scale provided to the patient today. When using our objective Pain Scale, levels between 6 and 10/10 are said to belong in an emergency room, as it progressively worsens from a 6/10, described as severely limiting, requiring emergency care not usually available at an outpatient pain management facility. At a 6/10 level, communication becomes difficult and requires great effort. Assistance to reach the emergency department may be required. Facial flushing and profuse sweating along with potentially dangerous increases in heart rate and blood pressure will be evident. Effect on ADL: unable to sleep due to the pain at night Timing: Constant Modifying factors: medication to help me sleep at night, exercise BP: 117/70  HR: 96  Onset and Duration: Gradual and Date of onset: 8 months ago Cause of pain: Unknown Severity: Getting worse, NAS-11 at its worse: 7/10, NAS-11 at its best: 2/10, NAS-11 now: 5/10 and NAS-11 on the average: 5/10 Timing: Night, After activity or exercise and After a period of immobility Aggravating Factors: Prolonged sitting and Prolonged standing Alleviating Factors: Stretching Associated Problems: Numbness, Tingling, Pain that wakes patient up and Pain that does not allow patient to sleep Quality of Pain: Annoying, Deep, Dull, Nagging, Numb and Uncomfortable Previous Examinations or Tests: MRI scan, X-rays, Nerve conduction test, Neurosurgical evaluation and Orthopedic evaluation Previous Treatments: Epidural steroid injections, Pool exercises and Stretching exercises  The patient  comes into the clinics today for the  first time for a chronic pain management evaluation.  According to the patient her primary area of pain is in her groin.  Mitts his pain has been going on for approximately 8 months.  She denies any previous accident or injury.  She denies any previous surgery or interventional therapy.  She does do home stretches which are effective.  MRI of left hip  Her second area of pain is in her hip.  She denies any previous surgery, interventional therapy or physical therapy.  She has had recent images of x-rays and MRI.  Her third area of pain is in her lower back.  She denies any previous surgery.  She has had epidural steroid injection by Dr. Sharlet Salina which was effective.  She performs home exercises.  She has not had a recent MRI.Marland Kitchen  Her last area of pain is in her legs.  She admits that the left is greater than the right.  She has numbness in the fronts of her legs but feels like the pain starts in the back and goes to the outer portion down to the calf occasionally to the bottom of her feet.  She denies any weakness.  She did have a nerve conduction study.  She was referred for lateral femoral cutaneous nerve block.  Today I took the time to provide the patient with information regarding this pain practice. The patient was informed that the practice is divided into two sections: an interventional pain management section, as well as a completely separate and distinct medication management section. I explained that there are procedure days for interventional therapies, and evaluation days for follow-ups and medication management. Because of the amount of documentation required during both, they are kept separated. This means that there is the possibility that she may be scheduled for a procedure on one day, and medication management the next. I have also informed her that because of staffing and facility limitations, this practice will no longer take patients for medication  management only. To illustrate the reasons for this, I gave the patient the example of surgeons, and how inappropriate it would be to refer a patient to his/her care, just to write for the post-surgical antibiotics on a surgery done by a different surgeon.   Because interventional pain management is part of the board-certified specialty for the doctors, the patient was informed that joining this practice means that they are open to any and all interventional therapies. I made it clear that this does not mean that they will be forced to have any procedures done. What this means is that I believe interventional therapies to be essential part of the diagnosis and proper management of chronic pain conditions. Therefore, patients not interested in these interventional alternatives will be better served under the care of a different practitioner.  The patient was also made aware of my Comprehensive Pain Management Safety Guidelines where by joining this practice, they limit all of their nerve blocks and joint injections to those done by our practice, for as long as we are retained to manage their care. Historic Controlled Substance Pharmacotherapy Review  PMP and historical list of controlled substances: Diazepam 5 mg, alprazolam 0.5 mg, chlordiazepoxide/amitriptyline 5/12.5 mg lorazepam 1 mg, hydrocodone/acetaminophen 5/500 mg Highest opioid analgesic regimen found: Codon/acetaminophen 5/500 mg 1 tablet 6 times daily (fill date 08/03/2012) hydrocodone 30 mg/day Most recent opioid analgesic: None Current opioid analgesics: None Highest recorded MME/day: 30 mg/day MME/day: 0 mg/day Medications: The patient did not bring the medication(s) to the appointment, as  requested in our "New Patient Package" Pharmacodynamics: Desired effects: Analgesia: The patient reports >50% benefit. Reported improvement in function: The patient reports medication allows her to accomplish basic ADLs. Clinically meaningful  improvement in function (CMIF): Sustained CMIF goals met Perceived effectiveness: Described as relatively effective, allowing for increase in activities of daily living (ADL) Undesirable effects: Side-effects or Adverse reactions: None reported Historical Monitoring: The patient  reports previous drug use. List of all UDS Test(s): No results found for: MDMA, COCAINSCRNUR, PCPSCRNUR, PCPQUANT, CANNABQUANT, THCU, [place] List of all Serum Drug Screening Test(s):  No results found for: AMPHSCRSER, BARBSCRSER, BENZOSCRSER, COCAINSCRSER, PCPSCRSER, PCPQUANT, THCSCRSER, CANNABQUANT, OPIATESCRSER, OXYSCRSER, PROPOXSCRSER Historical Background Evaluation: [place] PDMP: Six (6) year initial data search conducted.             [place] Department of public safety, offender search: Editor, commissioning Information) Non-contributory Risk Assessment Profile: Aberrant behavior: None observed or detected today Risk factors for fatal opioid overdose: None identified today Fatal overdose hazard ratio (HR): Calculation deferred Non-fatal overdose hazard ratio (HR): Calculation deferred Risk of opioid abuse or dependence: 0.7-3.0% with doses ? 36 MME/day and 6.1-26% with doses ? 120 MME/day. Substance use disorder (SUD) risk level: Pending results of Medical Psychology Evaluation for SUD Opioid risk tool (ORT) (Total Score): 1  ORT Scoring interpretation table:  Score <3 = Low Risk for SUD  Score between 4-7 = Moderate Risk for SUD  Score >8 = High Risk for Opioid Abuse   PHQ-2 Depression Scale:  Total score:    PHQ-2 Scoring interpretation table: (Score and probability of major depressive disorder)  Score 0 = No depression  Score 1 = 15.4% Probability  Score 2 = 21.1% Probability  Score 3 = 38.4% Probability  Score 4 = 45.5% Probability  Score 5 = 56.4% Probability  Score 6 = 78.6% Probability   PHQ-9 Depression Scale:  Total score:    PHQ-9 Scoring interpretation table:  Score 0-4 = No depression  Score 5-9 = Mild  depression  Score 10-14 = Moderate depression  Score 15-19 = Moderately severe depression  Score 20-27 = Severe depression (2.4 times higher risk of SUD and 2.89 times higher risk of overuse)   Pharmacologic Plan: Pending ordered tests and/or consults  Meds  The patient has a current medication list which includes the following prescription(s): betahistine dihydrochloride, cyanocobalamin-methylcobalamin, diclofenac, hydrochlorothiazide, ondansetron, vitamin e, and azelastine.  Current Outpatient Medications on File Prior to Visit  Medication Sig  . Betahistine HCl (BETAHISTINE DIHYDROCHLORIDE) POWD Use 24 mg once daily  . Cyanocobalamin-Methylcobalamin 600-600 MCG SUBL Place under the tongue.  . diclofenac (CATAFLAM) 50 MG tablet diclofenac potassium 50 mg tablet  TAKE 1 TABLET BY MOUTH EVERY 6 HOURS as needed  . hydrochlorothiazide (HYDRODIURIL) 12.5 MG tablet Take 12.5 mg by mouth daily.  . ondansetron (ZOFRAN-ODT) 8 MG disintegrating tablet   . vitamin E 600 UNIT capsule Take by mouth.  Marland Kitchen azelastine (ASTELIN) 0.1 % nasal spray Place into the nose.   No current facility-administered medications on file prior to visit.    Imaging Review  Lumbosacral Imaging: Lumbar MR wo contrast:  Results for orders placed during the hospital encounter of 12/23/17  MR LUMBAR SPINE WO CONTRAST   Narrative CLINICAL DATA:  Low back pain with left leg numbness and throbbing for 6 months.  EXAM: MRI LUMBAR SPINE WITHOUT CONTRAST  TECHNIQUE: Multiplanar, multisequence MR imaging of the lumbar spine was performed. No intravenous contrast was administered.  COMPARISON:  None.  FINDINGS: Segmentation:  Standard.  Alignment: Minimal left convex lumbar curvature. 3 mm anterolisthesis of L5 on S1 due to bilateral L5 pars defects.  Vertebrae: No fracture, suspicious osseous lesion, or significant marrow edema.  Conus medullaris and cauda equina: Conus extends to the L1 level. Conus and cauda  equina appear normal.  Paraspinal and other soft tissues: Unremarkable.  Disc levels:  Disc desiccation from L3-4 to L5-S1. Moderate to severe disc space narrowing at L4-5.  T12-L1: Slight left facet arthrosis.  Normal disc.  No stenosis.  L1-2: Negative.  L2-3: Negative.  L3-4: Shallow central disc protrusion with annular fissure without stenosis.  L4-5: Moderate-sized left paracentral disc protrusion and mild left facet hypertrophy result in mild to moderate left lateral recess stenosis without spinal or neural foraminal stenosis. The disc protrusion posteriorly deflects the left L5 nerve root.  L5-S1: Anterolisthesis with mild bulging of uncovered disc as well as a left foraminal pseudo disc extrusion resulting in moderate to severe left neural foraminal stenosis and potential left L5 nerve root impingement. No spinal stenosis.  IMPRESSION: 1. Bilateral L5 pars defects with grade 1 anterolisthesis. Left foraminal stenosis at L5-S1 due to a pseudo disc extrusion with potential L5 nerve root impingement. 2. L4-5 disc protrusion resulting in left lateral recess stenosis.   Electronically Signed   By: Logan Bores M.D.   On: 12/23/2017 16:42   Hip Imaging:  Hip-L MR w contrast:  Results for orders placed during the hospital encounter of 06/05/18  MR HIP LEFT W CONTRAST   Narrative CLINICAL DATA:  Left hip and groin pain for several months. No known injury.  EXAM: MRI OF THE LEFT HIP WITH CONTRAST (MR Arthrogram)  TECHNIQUE: Multiplanar, multisequence MR imaging of the hip was performed immediately following contrast injection into the hip joint under fluoroscopic guidance. No intravenous contrast was administered.  COMPARISON:  None.  FINDINGS: Bones: Marrow signal is normal throughout without fracture, stress change or focal lesion. There is no avascular necrosis of the femoral heads. No subchondral cyst formation or edema is seen about either  hip.  Articular cartilage and labrum  Articular cartilage:  Normal.  Labrum:  Intact.  Joint or bursal effusion  Joint effusion: The left hip is distended with contrast. No right effusion.  Bursae: Normal.  Muscles and tendons  Muscles and tendons:  Normal.  Other findings  Miscellaneous: Imaged intrapelvic contents demonstrate no acute or focal abnormality. IUD noted.  IMPRESSION: Negative for labral tear.  Normal MR arthrogram left hip.   Electronically Signed   By: Inge Rise M.D.   On: 06/05/2018 14:48   Note: Available results from prior imaging studies were reviewed.        ROS  Cardiovascular History: No reported cardiovascular signs or symptoms such as High blood pressure, coronary artery disease, abnormal heart rate or rhythm, heart attack, blood thinner therapy or heart weakness and/or failure Pulmonary or Respiratory History: No reported pulmonary signs or symptoms such as wheezing and difficulty taking a deep full breath (Asthma), difficulty blowing air out (Emphysema), coughing up mucus (Bronchitis), persistent dry cough, or temporary stoppage of breathing during sleep Neurological History: No reported neurological signs or symptoms such as seizures, abnormal skin sensations, urinary and/or fecal incontinence, being born with an abnormal open spine and/or a tethered spinal cord Review of Past Neurological Studies: No results found for this or any previous visit. Psychological-Psychiatric History: Anxiousness, Depressed and Difficulty sleeping and or falling asleep Gastrointestinal History: No reported gastrointestinal signs or symptoms such as vomiting or evacuating  blood, reflux, heartburn, alternating episodes of diarrhea and constipation, inflamed or scarred liver, or pancreas or irrregular and/or infrequent bowel movements Genitourinary History: No reported renal or genitourinary signs or symptoms such as difficulty voiding or producing urine, peeing  blood, non-functioning kidney, kidney stones, difficulty emptying the bladder, difficulty controlling the flow of urine, or chronic kidney disease Hematological History: No reported hematological signs or symptoms such as prolonged bleeding, low or poor functioning platelets, bruising or bleeding easily, hereditary bleeding problems, low energy levels due to low hemoglobin or being anemic Endocrine History: No reported endocrine signs or symptoms such as high or low blood sugar, rapid heart rate due to high thyroid levels, obesity or weight gain due to slow thyroid or thyroid disease Rheumatologic History: Joint aches and or swelling due to excess weight (Osteoarthritis) Musculoskeletal History: Negative for myasthenia gravis, muscular dystrophy, multiple sclerosis or malignant hyperthermia Work History: Working full time  Allergies  Amber Reed is allergic to amoxicillin.  Laboratory Chemistry  Inflammation Markers No results found for: CRP, ESRSEDRATE (CRP: Acute Phase) (ESR: Chronic Phase) Renal Function Markers No results found for: BUN, CREATININE, GFRAA, GFRNONAA Hepatic Function Markers No results found for: AST, ALT, ALBUMIN, ALKPHOS, HCVAB Electrolytes No results found for: NA, K, CL, CALCIUM, MG Neuropathy Markers No results found for: [id number] Bone Pathology Markers No results found for: Hendricks Milo, VD125OH2TOT, [id number], [id number], 25OHVITD1, 25OHVITD2, 25OHVITD3, CALCIUM, TESTOFREE, TESTOSTERONE Coagulation Parameters No results found for: INR, LABPROT, APTT, PLT Cardiovascular Markers No results found for: BNP, HGB, HCT Note: Lab results reviewed.  [place]  Drug: Amber Reed  reports previous drug use. Alcohol:  has no history on file for alcohol. Tobacco:  reports that she has never smoked. She has never used smokeless tobacco. Medical:  has a past medical history of Anxiety, Endometriosis, Factor 5 Leiden mutation, heterozygous ([place]), Meniere disease, Migraine, and  PCO (polycystic ovaries). Family: family history includes Cancer in her maternal grandmother; Diabetes in her mother; Seizures in her paternal aunt; Stroke in her paternal grandmother.  Past Surgical History:  Procedure Laterality Date  . CESAREAN SECTION    . NASAL SINUS SURGERY    . PELVIC LAPAROSCOPY    . SMALL INTESTINE SURGERY     Active Ambulatory Problems    Diagnosis Date Noted  . Endometriosis 01/10/2018  . Allergic rhinitis due to animal hair and dander 02/15/2017  . Degenerative disc disease, cervical 05/21/2014  . Dysfunction of right eustachian tube 02/15/2017  . Family history of factor V Leiden mutation 06/29/2018  . GAD (generalized anxiety disorder) 06/29/2018  . Hearing loss, sensorineural, asymmetrical 02/15/2017  . Insomnia 06/17/2014  . Lyme disease 06/29/2018  . Major depression 06/29/2018  . Meniere's disease of right ear 06/14/2017  . Migraines 05/21/2014  . Snoring 06/17/2014  . Spondylolisthesis at L5-S1 level 05/21/2014  . Spondylosis 06/29/2018  . Vaginal infection 06/29/2018  . Vestibular dizziness 02/15/2017  . Groin pain, chronic, left (Primary Area of Pain) 06/29/2018  . Chronic hip pain, left (Secondary Area of Pain) 06/29/2018  . Chronic left sacroiliac joint pain (Tertiary Area of Pain) 06/29/2018  . Chronic bilateral low back pain with bilateral sciatica (Fourth Area of Pain) (L>R) 06/29/2018  . Chronic pain of both lower extremities (L>R) 06/29/2018  . Chronic pain syndrome 06/29/2018  . Pharmacologic therapy 06/29/2018  . Disorder of skeletal system 06/29/2018  . Problems influencing health status 06/29/2018   Resolved Ambulatory Problems    Diagnosis Date Noted  . No Resolved Ambulatory Problems  Past Medical History:  Diagnosis Date  . Anxiety   . Factor 5 Leiden mutation, heterozygous ([place])   . Meniere disease   . Migraine   . PCO (polycystic ovaries)    Constitutional Exam  General appearance: Well nourished, well  developed, and well hydrated. In no apparent acute distress Vitals:   06/29/18 1020  BP: 117/70  Pulse: 96  Temp: 98.3 F (36.8 C)  SpO2: 99%  Weight: 195 lb (88.5 kg)  Height: '5\' 10"'  (1.778 m)   BMI Assessment: Estimated body mass index is 27.98 kg/m as calculated from the following:   Height as of this encounter: '5\' 10"'  (1.778 m).   Weight as of this encounter: 195 lb (88.5 kg).  BMI interpretation table: BMI level Category Range association with higher incidence of chronic pain  <18 kg/m2 Underweight   18.5-24.9 kg/m2 Ideal body weight   25-29.9 kg/m2 Overweight Increased incidence by 20%  30-34.9 kg/m2 Obese (Class I) Increased incidence by 68%  35-39.9 kg/m2 Severe obesity (Class II) Increased incidence by 136%  >40 kg/m2 Extreme obesity (Class III) Increased incidence by 254%   BMI Readings from Last 4 Encounters:  06/29/18 27.98 kg/m  02/23/18 28.31 kg/m  01/10/18 28.52 kg/m   Wt Readings from Last 4 Encounters:  06/29/18 195 lb (88.5 kg)  02/23/18 203 lb (92.1 kg)  01/10/18 204 lb 8 oz (92.8 kg)  Psych/Mental status: Alert, oriented x 3 (person, place, & time)       Eyes: PERLA Respiratory: No evidence of acute respiratory distress  Cervical Spine Exam  Inspection: No masses, redness, or swelling Alignment: Symmetrical Functional ROM: Unrestricted ROM      Stability: No instability detected Muscle strength & Tone: Functionally intact Sensory: Unimpaired Palpation: No palpable anomalies              Upper Extremity (UE) Exam    Side: Right upper extremity  Side: Left upper extremity  Inspection: No masses, redness, swelling, or asymmetry. No contractures  Inspection: No masses, redness, swelling, or asymmetry. No contractures  Functional ROM: Unrestricted ROM          Functional ROM: Unrestricted ROM          Muscle strength & Tone: Functionally intact  Muscle strength & Tone: Functionally intact  Sensory: Unimpaired  Sensory: Unimpaired  Palpation: No  palpable anomalies              Palpation: No palpable anomalies              Specialized Test(s): Deferred         Specialized Test(s): Deferred          Thoracic Spine Exam  Inspection: No masses, redness, or swelling Alignment: Symmetrical Functional ROM: Unrestricted ROM Stability: No instability detected Sensory: Unimpaired Muscle strength & Tone: No palpable anomalies  Lumbar Spine Exam  Inspection: No masses, redness, or swelling Alignment: Symmetrical Functional ROM: Unrestricted ROM      Stability: No instability detected Muscle strength & Tone: Functionally intact Sensory: Unimpaired Palpation: Complains of area being tender to palpation       Provocative Tests: Lumbar Hyperextension and rotation test: Positive on the left for facet joint pain. Patrick's Maneuver: Positive for left-sided S-I arthralgia              Gait & Posture Assessment  Ambulation: Unassisted Gait: Relatively normal for age and body habitus Posture: WNL   Lower Extremity Exam    Side: Right lower extremity  Side:  Left lower extremity  Inspection: No masses, redness, swelling, or asymmetry. No contractures  Inspection: No masses, redness, swelling, or asymmetry. No contractures  Functional ROM: Unrestricted ROM          Functional ROM: Unrestricted ROM          Muscle strength & Tone: Able to Toe-walk & Heel-walk without problems  Muscle strength & Tone: Able to Toe-walk & Heel-walk without problems  Sensory: Unimpaired  Sensory: Unimpaired  Palpation: No palpable anomalies  Palpation: Tender   Assessment  Primary Diagnosis & Pertinent Problem List: The primary encounter diagnosis was Groin pain, chronic, left (Primary Area of Pain). Diagnoses of Chronic hip pain, left (Secondary Area of Pain), Chronic left sacroiliac joint pain (Tertiary Area of Pain), Chronic bilateral low back pain with bilateral sciatica (Fourth Area of Pain) (L>R), Chronic pain of both lower extremities (L>R), Chronic pain  syndrome, Pharmacologic therapy, Disorder of skeletal system, and Problems influencing health status were also pertinent to this visit.  Visit Diagnosis: 1. Groin pain, chronic, left (Primary Area of Pain)   2. Chronic hip pain, left (Secondary Area of Pain)   3. Chronic left sacroiliac joint pain (Tertiary Area of Pain)   4. Chronic bilateral low back pain with bilateral sciatica (Fourth Area of Pain) (L>R)   5. Chronic pain of both lower extremities (L>R)   6. Chronic pain syndrome   7. Pharmacologic therapy   8. Disorder of skeletal system   9. Problems influencing health status    Plan of Care  Initial treatment plan:  Please be advised that as per protocol, today's visit has been an evaluation only. We have not taken over the patient's controlled substance management.  Problem-specific plan: No problem-specific Assessment & Plan notes found for this encounter.  Ordered Lab-work, Procedure(s), Referral(s), & Consult(s): Orders Placed This Encounter  Procedures  . Compliance Drug Analysis, Ur  . Comp. Metabolic Panel (12)  . Magnesium  . Vitamin B12  . Sedimentation rate  . 25-Hydroxyvitamin D Lcms D2+D3  . C-reactive protein   Pharmacotherapy: Medications ordered:  No orders of the defined types were placed in this encounter.  Medications administered during this visit: Amber Reed had no medications administered during this visit.   Pharmacotherapy under consideration:  Opioid Analgesics: The patient was informed that there is no guarantee that she would be a candidate for opioid analgesics. The decision will be made following CDC guidelines. This decision will be based on the results of diagnostic studies, as well as Amber Reed's risk profile.  Membrane stabilizer: To be determined at a later time Muscle relaxant: To be determined at a later time NSAID: To be determined at a later time Other analgesic(s): To be determined at a later time   Interventional  therapies under consideration: Amber Reed was informed that there is no guarantee that she would be a candidate for interventional therapies. The decision will be based on the results of diagnostic studies, as well as Amber Reed's risk profile.  Possible procedure(s): Diagnostic left lateral femoral nerve block Diagnostic left intra-articular hip injection Diagnostic left sacroiliac joint injection Possible left sacroiliac radiofrequency ablation Diagnostic midline lumbar epidural steroid injections Diagnostic bilateral lumbar facet nerve blocks Possible bilateral lumbar facet radiofrequency ablation   Provider-requested follow-up: Return for 2nd Visit, w/ Dr. Dossie Arbour.  Future Appointments  Date Time Provider [place]  07/24/2018  9:30 AM Milinda Pointer, MD [facility] None    Primary Care Physician: Elisabeth Most, MD Location: [facility] Outpatient Pain  Management Facility Note by:  Date: 06/29/2018; Time: 11:57 AM  Pain Score Disclaimer: We use the NRS-11 scale. This is a self-reported, subjective measurement of pain severity with only modest accuracy. It is used primarily to identify changes within a particular patient. It must be understood that outpatient pain scales are significantly less accurate that those used for research, where they can be applied under ideal controlled circumstances with minimal exposure to variables. In reality, the score is likely to be a combination of pain intensity and pain affect, where pain affect describes the degree of emotional arousal or changes in action readiness caused by the sensory experience of pain. Factors such as social and work situation, setting, emotional state, anxiety levels, expectation, and prior pain experience may influence pain perception and show large inter-individual differences that may also be affected by time variables.  Patient instructions provided during this appointment: Patient Instructions    ____________________________________________________________________________________________  Appointment Policy Summary  It is our goal and responsibility to provide the medical community with assistance in the evaluation and management of patients with chronic pain. Unfortunately our resources are limited. Because we do not have an unlimited amount of time, or available appointments, we are required to closely monitor and manage their use. The following rules exist to maximize their use:  Patient's responsibilities: 1. Punctuality:  At what time should I arrive? You should be physically present in our office 30 minutes before your scheduled appointment. Your scheduled appointment is with your assigned healthcare provider. However, it takes 5-10 minutes to be "checked-in", and another 15 minutes for the nurses to do the admission. If you arrive to our office at the time you were given for your appointment, you will end up being at least 20-25 minutes late to your appointment with the provider. 2. Tardiness:  What happens if I arrive only a few minutes after my scheduled appointment time? You will need to reschedule your appointment. The cutoff is your appointment time. This is why it is so important that you arrive at least 30 minutes before that appointment. If you have an appointment scheduled for 10:00 AM and you arrive at 10:01, you will be required to reschedule your appointment.  3. Plan ahead:  Always assume that you will encounter traffic on your way in. Plan for it. If you are dependent on a driver, make sure they understand these rules and the need to arrive early. 4. Other appointments and responsibilities:  Avoid scheduling any other appointments before or after your pain clinic appointments.  5. Be prepared:  Write down everything that you need to discuss with your healthcare provider and give this information to the admitting nurse. Write down the medications that you will need  refilled. Bring your pills and bottles (even the empty ones), to all of your appointments, except for those where a procedure is scheduled. 6. No children or pets:  Find someone to take care of them. It is not appropriate to bring them in. 7. Scheduling changes:  We request "advanced notification" of any changes or cancellations. 8. Advanced notification:  Defined as a time period of more than 24 hours prior to the originally scheduled appointment. This allows for the appointment to be offered to other patients. 9. Rescheduling:  When a visit is rescheduled, it will require the cancellation of the original appointment. For this reason they both fall within the category of "Cancellations".  10. Cancellations:  They require advanced notification. Any cancellation less than 24 hours before the  appointment  will be recorded as a "No Show". 11. No Show:  Defined as an unkept appointment where the patient failed to notify or declare to the practice their intention or inability to keep the appointment.  Corrective process for repeat offenders:  1. Tardiness: Three (3) episodes of rescheduling due to late arrivals will be recorded as one (1) "No Show". 2. Cancellation or reschedule: Three (3) cancellations or rescheduling will be recorded as one (1) "No Show". 3. "No Shows": Three (3) "No Shows" within a 12 month period will result in discharge from the practice. ____________________________________________________________________________________________   ______________________________________________________________________________________________  Specialty Pain Scale  Introduction:  There are significant differences in how pain is reported. The word pain usually refers to physical pain, but it is also a common synonym of suffering. The medical community uses a scale from 0 (zero) to 10 (ten) to report pain level. Zero (0) is described as "no pain", while ten (10) is described as "the worse pain  you can imagine". The problem with this scale is that physical pain is reported along with suffering. Suffering refers to mental pain, or more often yet it refers to any unpleasant feeling, emotion or aversion associated with the perception of harm or threat of harm. It is the psychological component of pain.  Pain Specialists prefer to separate the two components. The pain scale used by this practice is the Verbal Numerical Rating Scale (VNRS-11). This scale is for the physical pain only. DO NOT INCLUDE how your pain psychologically affects you. This scale is for adults 61 years of age and older. It has 11 (eleven) levels. The 1st level is 0/10. This means: "right now, I have no pain". In the context of pain management, it also means: "right now, my physical pain is under control with the current therapy".  General Information:  The scale should reflect your current level of pain. Unless you are specifically asked for the level of your worst pain, or your average pain. If you are asked for one of these two, then it should be understood that it is over the past 24 hours.  Levels 1 (one) through 5 (five) are described below, and can be treated as an outpatient. Ambulatory pain management facilities such as ours are more than adequate to treat these levels. Levels 6 (six) through 10 (ten) are also described below, however, these must be treated as a hospitalized patient. While levels 6 (six) and 7 (seven) may be evaluated at an urgent care facility, levels 8 (eight) through 10 (ten) constitute medical emergencies and as such, they belong in a hospital's emergency department. When having these levels (as described below), do not come to our office. Our facility is not equipped to manage these levels. Go directly to an urgent care facility or an emergency department to be evaluated.  Definitions:  Activities of Daily Living (ADL): Activities of daily living (ADL or ADLs) is a term used in healthcare to refer to  people's daily self-care activities. Health professionals often use a person's ability or inability to perform ADLs as a measurement of their functional status, particularly in regard to people post injury, with disabilities and the elderly. There are two ADL levels: Basic and Instrumental. Basic Activities of Daily Living (BADL  or BADLs) consist of self-care tasks that include: Bathing and showering; personal hygiene and grooming (including brushing/combing/styling hair); dressing; Toilet hygiene (getting to the toilet, cleaning oneself, and getting back up); eating and self-feeding (not including cooking or chewing and swallowing); functional mobility, often  referred to as "transferring", as measured by the ability to walk, get in and out of bed, and get into and out of a chair; the broader definition (moving from one place to another while performing activities) is useful for people with different physical abilities who are still able to get around independently. Basic ADLs include the things many people do when they get up in the morning and get ready to go out of the house: get out of bed, go to the toilet, bathe, dress, groom, and eat. On the average, loss of function typically follows a particular order. Hygiene is the first to go, followed by loss of toilet use and locomotion. The last to go is the ability to eat. When there is only one remaining area in which the person is independent, there is a 62.9% chance that it is eating and only a 3.5% chance that it is hygiene. Instrumental Activities of Daily Living (IADL or IADLs) are not necessary for fundamental functioning, but they let an individual live independently in a community. IADL consist of tasks that include: cleaning and maintaining the house; home establishment and maintenance; care of others (including selecting and supervising caregivers); care of pets; child rearing; managing money; managing financials (investments, etc.); meal preparation  and cleanup; shopping for groceries and necessities; moving within the community; safety procedures and emergency responses; health management and maintenance (taking prescribed medications); and using the telephone or other form of communication.  Instructions:  Most patients tend to report their pain as a combination of two factors, their physical pain and their psychosocial pain. This last one is also known as "suffering" and it is reflection of how physical pain affects you socially and psychologically. From now on, report them separately.  From this point on, when asked to report your pain level, report only your physical pain. Use the following table for reference.  Pain Clinic Pain Levels (0-5/10)  Pain Level Score  Description  No Pain 0   Mild pain 1 Nagging, annoying, but does not interfere with basic activities of daily living (ADL). Patients are able to eat, bathe, get dressed, toileting (being able to get on and off the toilet and perform personal hygiene functions), transfer (move in and out of bed or a chair without assistance), and maintain continence (able to control bladder and bowel functions). Blood pressure and heart rate are unaffected. A normal heart rate for a healthy adult ranges from 60 to 100 bpm (beats per minute).   Mild to moderate pain 2 Noticeable and distracting. Impossible to hide from other people. More frequent flare-ups. Still possible to adapt and function close to normal. It can be very annoying and may have occasional stronger flare-ups. With discipline, patients may get used to it and adapt.   Moderate pain 3 Interferes significantly with activities of daily living (ADL). It becomes difficult to feed, bathe, get dressed, get on and off the toilet or to perform personal hygiene functions. Difficult to get in and out of bed or a chair without assistance. Very distracting. With effort, it can be ignored when deeply involved in activities.   Moderately severe pain  4 Impossible to ignore for more than a few minutes. With effort, patients may still be able to manage work or participate in some social activities. Very difficult to concentrate. Signs of autonomic nervous system discharge are evident: dilated pupils (mydriasis); mild sweating (diaphoresis); sleep interference. Heart rate becomes elevated (>115 bpm). Diastolic blood pressure (lower number) rises above 100 mmHg. Patients  find relief in laying down and not moving.   Severe pain 5 Intense and extremely unpleasant. Associated with frowning face and frequent crying. Pain overwhelms the senses.  Ability to do any activity or maintain social relationships becomes significantly limited. Conversation becomes difficult. Pacing back and forth is common, as getting into a comfortable position is nearly impossible. Pain wakes you up from deep sleep. Physical signs will be obvious: pupillary dilation; increased sweating; goosebumps; brisk reflexes; cold, clammy hands and feet; nausea, vomiting or dry heaves; loss of appetite; significant sleep disturbance with inability to fall asleep or to remain asleep. When persistent, significant weight loss is observed due to the complete loss of appetite and sleep deprivation.  Blood pressure and heart rate becomes significantly elevated. Caution: If elevated blood pressure triggers a pounding headache associated with blurred vision, then the patient should immediately seek attention at an urgent or emergency care unit, as these may be signs of an impending stroke.    Emergency Department Pain Levels (6-10/10)  Emergency Room Pain 6 Severely limiting. Requires emergency care and should not be seen or managed at an outpatient pain management facility. Communication becomes difficult and requires great effort. Assistance to reach the emergency department may be required. Facial flushing and profuse sweating along with potentially dangerous increases in heart rate and blood pressure  will be evident.   Distressing pain 7 Self-care is very difficult. Assistance is required to transport, or use restroom. Assistance to reach the emergency department will be required. Tasks requiring coordination, such as bathing and getting dressed become very difficult.   Disabling pain 8 Self-care is no longer possible. At this level, pain is disabling. The individual is unable to do even the most "basic" activities such as walking, eating, bathing, dressing, transferring to a bed, or toileting. Fine motor skills are lost. It is difficult to think clearly.   Incapacitating pain 9 Pain becomes incapacitating. Thought processing is no longer possible. Difficult to remember your own name. Control of movement and coordination are lost.   The worst pain imaginable 10 At this level, most patients pass out from pain. When this level is reached, collapse of the autonomic nervous system occurs, leading to a sudden drop in blood pressure and heart rate. This in turn results in a temporary and dramatic drop in blood flow to the brain, leading to a loss of consciousness. Fainting is one of the body's self defense mechanisms. Passing out puts the brain in a calmed state and causes it to shut down for a while, in order to begin the healing process.    Summary: 1. Refer to this scale when providing [place] with your pain level. 2. Be accurate and careful when reporting your pain level. This will help with your care. 3. Over-reporting your pain level will lead to loss of credibility. 4. Even a level of 1/10 means that there is pain and will be treated at our facility. 5. High, inaccurate reporting will be documented as "Symptom Exaggeration", leading to loss of credibility and suspicions of possible secondary gains such as obtaining more narcotics, or wanting to appear disabled, for fraudulent reasons. 6. Only pain levels of 5 or below will be seen at our facility. 7. Pain levels of 6 and above will be sent to the  Emergency Department and the appointment cancelled. ______________________________________________________________________________________________    BMI Assessment: Estimated body mass index is 27.98 kg/m as calculated from the following:   Height as of this encounter: '5\' 10"'  (1.778 m).  Weight as of this encounter: 195 lb (88.5 kg).  BMI interpretation table: BMI level Category Range association with higher incidence of chronic pain  <18 kg/m2 Underweight   18.5-24.9 kg/m2 Ideal body weight   25-29.9 kg/m2 Overweight Increased incidence by 20%  30-34.9 kg/m2 Obese (Class I) Increased incidence by 68%  35-39.9 kg/m2 Severe obesity (Class II) Increased incidence by 136%  >40 kg/m2 Extreme obesity (Class III) Increased incidence by 254%   BMI Readings from Last 4 Encounters:  06/29/18 27.98 kg/m  02/23/18 28.31 kg/m  01/10/18 28.52 kg/m   Wt Readings from Last 4 Encounters:  06/29/18 195 lb (88.5 kg)  02/23/18 203 lb (92.1 kg)  01/10/18 204 lb 8 oz (92.8 kg)

## 2018-06-29 NOTE — Patient Instructions (Signed)
____________________________________________________________________________________________  Appointment Policy Summary  It is our goal and responsibility to provide the medical community with assistance in the evaluation and management of patients with chronic pain. Unfortunately our resources are limited. Because we do not have an unlimited amount of time, or available appointments, we are required to closely monitor and manage their use. The following rules exist to maximize their use:  Patient's responsibilities: 1. Punctuality:  At what time should I arrive? You should be physically present in our office 30 minutes before your scheduled appointment. Your scheduled appointment is with your assigned healthcare provider. However, it takes 5-10 minutes to be "checked-in", and another 15 minutes for the nurses to do the admission. If you arrive to our office at the time you were given for your appointment, you will end up being at least 20-25 minutes late to your appointment with the provider. 2. Tardiness:  What happens if I arrive only a few minutes after my scheduled appointment time? You will need to reschedule your appointment. The cutoff is your appointment time. This is why it is so important that you arrive at least 30 minutes before that appointment. If you have an appointment scheduled for 10:00 AM and you arrive at 10:01, you will be required to reschedule your appointment.  3. Plan ahead:  Always assume that you will encounter traffic on your way in. Plan for it. If you are dependent on a driver, make sure they understand these rules and the need to arrive early. 4. Other appointments and responsibilities:  Avoid scheduling any other appointments before or after your pain clinic appointments.  5. Be prepared:  Write down everything that you need to discuss with your healthcare provider and give this information to the admitting nurse. Write down the medications that you will need  refilled. Bring your pills and bottles (even the empty ones), to all of your appointments, except for those where a procedure is scheduled. 6. No children or pets:  Find someone to take care of them. It is not appropriate to bring them in. 7. Scheduling changes:  We request "advanced notification" of any changes or cancellations. 8. Advanced notification:  Defined as a time period of more than 24 hours prior to the originally scheduled appointment. This allows for the appointment to be offered to other patients. 9. Rescheduling:  When a visit is rescheduled, it will require the cancellation of the original appointment. For this reason they both fall within the category of "Cancellations".  10. Cancellations:  They require advanced notification. Any cancellation less than 24 hours before the  appointment will be recorded as a "No Show". 11. No Show:  Defined as an unkept appointment where the patient failed to notify or declare to the practice their intention or inability to keep the appointment.  Corrective process for repeat offenders:  1. Tardiness: Three (3) episodes of rescheduling due to late arrivals will be recorded as one (1) "No Show". 2. Cancellation or reschedule: Three (3) cancellations or rescheduling will be recorded as one (1) "No Show". 3. "No Shows": Three (3) "No Shows" within a 12 month period will result in discharge from the practice. ____________________________________________________________________________________________   ______________________________________________________________________________________________  Specialty Pain Scale  Introduction:  There are significant differences in how pain is reported. The word pain usually refers to physical pain, but it is also a common synonym of suffering. The medical community uses a scale from 0 (zero) to 10 (ten) to report pain level. Zero (0) is described as "no pain",   while ten (10) is described as "the worse pain  you can imagine". The problem with this scale is that physical pain is reported along with suffering. Suffering refers to mental pain, or more often yet it refers to any unpleasant feeling, emotion or aversion associated with the perception of harm or threat of harm. It is the psychological component of pain.  Pain Specialists prefer to separate the two components. The pain scale used by this practice is the Verbal Numerical Rating Scale (VNRS-11). This scale is for the physical pain only. DO NOT INCLUDE how your pain psychologically affects you. This scale is for adults 21 years of age and older. It has 11 (eleven) levels. The 1st level is 0/10. This means: "right now, I have no pain". In the context of pain management, it also means: "right now, my physical pain is under control with the current therapy".  General Information:  The scale should reflect your current level of pain. Unless you are specifically asked for the level of your worst pain, or your average pain. If you are asked for one of these two, then it should be understood that it is over the past 24 hours.  Levels 1 (one) through 5 (five) are described below, and can be treated as an outpatient. Ambulatory pain management facilities such as ours are more than adequate to treat these levels. Levels 6 (six) through 10 (ten) are also described below, however, these must be treated as a hospitalized patient. While levels 6 (six) and 7 (seven) may be evaluated at an urgent care facility, levels 8 (eight) through 10 (ten) constitute medical emergencies and as such, they belong in a hospital's emergency department. When having these levels (as described below), do not come to our office. Our facility is not equipped to manage these levels. Go directly to an urgent care facility or an emergency department to be evaluated.  Definitions:  Activities of Daily Living (ADL): Activities of daily living (ADL or ADLs) is a term used in healthcare to refer to  people's daily self-care activities. Health professionals often use a person's ability or inability to perform ADLs as a measurement of their functional status, particularly in regard to people post injury, with disabilities and the elderly. There are two ADL levels: Basic and Instrumental. Basic Activities of Daily Living (BADL  or BADLs) consist of self-care tasks that include: Bathing and showering; personal hygiene and grooming (including brushing/combing/styling hair); dressing; Toilet hygiene (getting to the toilet, cleaning oneself, and getting back up); eating and self-feeding (not including cooking or chewing and swallowing); functional mobility, often referred to as "transferring", as measured by the ability to walk, get in and out of bed, and get into and out of a chair; the broader definition (moving from one place to another while performing activities) is useful for people with different physical abilities who are still able to get around independently. Basic ADLs include the things many people do when they get up in the morning and get ready to go out of the house: get out of bed, go to the toilet, bathe, dress, groom, and eat. On the average, loss of function typically follows a particular order. Hygiene is the first to go, followed by loss of toilet use and locomotion. The last to go is the ability to eat. When there is only one remaining area in which the person is independent, there is a 62.9% chance that it is eating and only a 3.5% chance that it is hygiene. Instrumental Activities   of Daily Living (IADL or IADLs) are not necessary for fundamental functioning, but they let an individual live independently in a community. IADL consist of tasks that include: cleaning and maintaining the house; home establishment and maintenance; care of others (including selecting and supervising caregivers); care of pets; child rearing; managing money; managing financials (investments, etc.); meal preparation  and cleanup; shopping for groceries and necessities; moving within the community; safety procedures and emergency responses; health management and maintenance (taking prescribed medications); and using the telephone or other form of communication.  Instructions:  Most patients tend to report their pain as a combination of two factors, their physical pain and their psychosocial pain. This last one is also known as "suffering" and it is reflection of how physical pain affects you socially and psychologically. From now on, report them separately.  From this point on, when asked to report your pain level, report only your physical pain. Use the following table for reference.  Pain Clinic Pain Levels (0-5/10)  Pain Level Score  Description  No Pain 0   Mild pain 1 Nagging, annoying, but does not interfere with basic activities of daily living (ADL). Patients are able to eat, bathe, get dressed, toileting (being able to get on and off the toilet and perform personal hygiene functions), transfer (move in and out of bed or a chair without assistance), and maintain continence (able to control bladder and bowel functions). Blood pressure and heart rate are unaffected. A normal heart rate for a healthy adult ranges from 60 to 100 bpm (beats per minute).   Mild to moderate pain 2 Noticeable and distracting. Impossible to hide from other people. More frequent flare-ups. Still possible to adapt and function close to normal. It can be very annoying and may have occasional stronger flare-ups. With discipline, patients may get used to it and adapt.   Moderate pain 3 Interferes significantly with activities of daily living (ADL). It becomes difficult to feed, bathe, get dressed, get on and off the toilet or to perform personal hygiene functions. Difficult to get in and out of bed or a chair without assistance. Very distracting. With effort, it can be ignored when deeply involved in activities.   Moderately severe pain  4 Impossible to ignore for more than a few minutes. With effort, patients may still be able to manage work or participate in some social activities. Very difficult to concentrate. Signs of autonomic nervous system discharge are evident: dilated pupils (mydriasis); mild sweating (diaphoresis); sleep interference. Heart rate becomes elevated (>115 bpm). Diastolic blood pressure (lower number) rises above 100 mmHg. Patients find relief in laying down and not moving.   Severe pain 5 Intense and extremely unpleasant. Associated with frowning face and frequent crying. Pain overwhelms the senses.  Ability to do any activity or maintain social relationships becomes significantly limited. Conversation becomes difficult. Pacing back and forth is common, as getting into a comfortable position is nearly impossible. Pain wakes you up from deep sleep. Physical signs will be obvious: pupillary dilation; increased sweating; goosebumps; brisk reflexes; cold, clammy hands and feet; nausea, vomiting or dry heaves; loss of appetite; significant sleep disturbance with inability to fall asleep or to remain asleep. When persistent, significant weight loss is observed due to the complete loss of appetite and sleep deprivation.  Blood pressure and heart rate becomes significantly elevated. Caution: If elevated blood pressure triggers a pounding headache associated with blurred vision, then the patient should immediately seek attention at an urgent or emergency care unit, as   these may be signs of an impending stroke.    Emergency Department Pain Levels (6-10/10)  Emergency Room Pain 6 Severely limiting. Requires emergency care and should not be seen or managed at an outpatient pain management facility. Communication becomes difficult and requires great effort. Assistance to reach the emergency department may be required. Facial flushing and profuse sweating along with potentially dangerous increases in heart rate and blood pressure  will be evident.   Distressing pain 7 Self-care is very difficult. Assistance is required to transport, or use restroom. Assistance to reach the emergency department will be required. Tasks requiring coordination, such as bathing and getting dressed become very difficult.   Disabling pain 8 Self-care is no longer possible. At this level, pain is disabling. The individual is unable to do even the most "basic" activities such as walking, eating, bathing, dressing, transferring to a bed, or toileting. Fine motor skills are lost. It is difficult to think clearly.   Incapacitating pain 9 Pain becomes incapacitating. Thought processing is no longer possible. Difficult to remember your own name. Control of movement and coordination are lost.   The worst pain imaginable 10 At this level, most patients pass out from pain. When this level is reached, collapse of the autonomic nervous system occurs, leading to a sudden drop in blood pressure and heart rate. This in turn results in a temporary and dramatic drop in blood flow to the brain, leading to a loss of consciousness. Fainting is one of the body's self defense mechanisms. Passing out puts the brain in a calmed state and causes it to shut down for a while, in order to begin the healing process.    Summary: 1. Refer to this scale when providing us with your pain level. 2. Be accurate and careful when reporting your pain level. This will help with your care. 3. Over-reporting your pain level will lead to loss of credibility. 4. Even a level of 1/10 means that there is pain and will be treated at our facility. 5. High, inaccurate reporting will be documented as "Symptom Exaggeration", leading to loss of credibility and suspicions of possible secondary gains such as obtaining more narcotics, or wanting to appear disabled, for fraudulent reasons. 6. Only pain levels of 5 or below will be seen at our facility. 7. Pain levels of 6 and above will be sent to the  Emergency Department and the appointment cancelled. ______________________________________________________________________________________________    BMI Assessment: Estimated body mass index is 27.98 kg/m as calculated from the following:   Height as of this encounter: 5\' 10"  (1.778 m).   Weight as of this encounter: 195 lb (88.5 kg).  BMI interpretation table: BMI level Category Range association with higher incidence of chronic pain  <18 kg/m2 Underweight   18.5-24.9 kg/m2 Ideal body weight   25-29.9 kg/m2 Overweight Increased incidence by 20%  30-34.9 kg/m2 Obese (Class I) Increased incidence by 68%  35-39.9 kg/m2 Severe obesity (Class II) Increased incidence by 136%  >40 kg/m2 Extreme obesity (Class III) Increased incidence by 254%   BMI Readings from Last 4 Encounters:  06/29/18 27.98 kg/m  02/23/18 28.31 kg/m  01/10/18 28.52 kg/m   Wt Readings from Last 4 Encounters:  06/29/18 195 lb (88.5 kg)  02/23/18 203 lb (92.1 kg)  01/10/18 204 lb 8 oz (92.8 kg)

## 2018-07-03 LAB — COMP. METABOLIC PANEL (12)
ALK PHOS: 68 IU/L (ref 39–117)
AST: 15 IU/L (ref 0–40)
Albumin/Globulin Ratio: 2.1 (ref 1.2–2.2)
Albumin: 4.8 g/dL (ref 3.5–5.5)
BUN/Creatinine Ratio: 17 (ref 9–23)
BUN: 13 mg/dL (ref 6–20)
Bilirubin Total: 0.7 mg/dL (ref 0.0–1.2)
CREATININE: 0.75 mg/dL (ref 0.57–1.00)
Calcium: 9.9 mg/dL (ref 8.7–10.2)
Chloride: 99 mmol/L (ref 96–106)
GFR calc Af Amer: 118 mL/min/{1.73_m2} (ref >59)
GFR, EST NON AFRICAN AMERICAN: 102 mL/min/{1.73_m2} (ref >59)
Globulin, Total: 2.3 g/dL (ref 1.5–4.5)
Glucose: 93 mg/dL (ref 65–99)
Potassium: 3.8 mmol/L (ref 3.5–5.2)
Sodium: 139 mmol/L (ref 134–144)
Total Protein: 7.1 g/dL (ref 6.0–8.5)

## 2018-07-03 LAB — VITAMIN B12: Vitamin B-12: 1548 pg/mL — ABNORMAL HIGH (ref 232–1245)

## 2018-07-03 LAB — SEDIMENTATION RATE: SED RATE: 15 mm/h (ref 0–32)

## 2018-07-03 LAB — 25-HYDROXYVITAMIN D LCMS D2+D3: 25-HYDROXY, VITAMIN D-3: 44 ng/mL

## 2018-07-03 LAB — MAGNESIUM: Magnesium: 1.9 mg/dL (ref 1.6–2.3)

## 2018-07-03 LAB — 25-HYDROXY VITAMIN D LCMS D2+D3: 25-Hydroxy, Vitamin D: 45 ng/mL

## 2018-07-03 LAB — C-REACTIVE PROTEIN: CRP: 7 mg/L (ref 0–10)

## 2018-07-04 LAB — COMPLIANCE DRUG ANALYSIS, UR

## 2018-07-23 NOTE — Progress Notes (Signed)
Patient's Name: Amber Reed  MRN: [id number]  Referring Provider: Girard Cooter*  DOB: October 08, 1980  PCP: Elisabeth Most, MD  DOS: 07/24/2018  Note by: Gaspar Cola, MD  Service setting: Ambulatory outpatient  Specialty: Interventional Pain Management  Location: [facility] (AMB) Pain Management Facility    Patient type: Established   Primary Reason(s) for Visit: Encounter for evaluation before starting new chronic pain management plan of care (Level of risk: moderate) CC: Groin Pain  HPI  Amber Reed is a 38 y.o. year old, female patient, who comes today for a follow-up evaluation to review the test results and decide on a treatment plan. She has Endometriosis; Allergic rhinitis due to animal hair and dander; DDD (degenerative disc disease), cervical; Dysfunction of right eustachian tube; Family history of factor V Leiden mutation; GAD (generalized anxiety disorder); Hearing loss, sensorineural, asymmetrical; Insomnia; Lyme disease; Major depression; Meniere's disease of right ear; Migraines; Snoring; Spondylolisthesis at L5-S1 level; Spondylosis; Vaginal infection; Vestibular dizziness; Chronic groin pain (Primary Area of Pain) (Left); Chronic hip pain (Secondary Area of Pain) (Left); Chronic low back pain (Fourth Area of Pain) (Bilateral) (L>R) w/ sciatica (Bilateral); Chronic lower extremity pain (Bilateral) (L>R); Chronic pain syndrome; Pharmacologic therapy; Disorder of skeletal system; Problems influencing health status; Abnormal MRI, lumbar spine (12/23/2017); Lumbar facet arthropathy; Lumbar facet joint syndrome; Grade 1 Anterolisthesis L5/S1; Lumbar lateral recess stenosis; Lumbar foraminal stenosis; Lumbar nerve root impingement (L5) (Left); Lumbar pars defect (L5) (Bilateral); Lumbar L5 pars defect w/ spondylolisthesis; Chronic musculoskeletal pain; and Chronic sacroiliac joint pain (Tertiary Area of Pain) (Right) on their problem list. Her primarily concern today is the Groin  Pain  Pain Assessment: Location: Left Groin(left hip and left buttocks ) Radiating: Pain radiates only in the groin to left hip area, however the numbness radiates from groin to front of both things to then back of lower legs (calves)  Onset: More than a month ago Duration: Chronic pain Quality: Constant, Dull, Numbness, Discomfort(worse at night ) Severity: 6 /10 (subjective, self-reported pain score)  Note: Reported level is compatible with observation.                         When using our objective Pain Scale, levels between 6 and 10/10 are said to belong in an emergency room, as it progressively worsens from a 6/10, described as severely limiting, requiring emergency care not usually available at an outpatient pain management facility. At a 6/10 level, communication becomes difficult and requires great effort. Assistance to reach the emergency department may be required. Facial flushing and profuse sweating along with potentially dangerous increases in heart rate and blood pressure will be evident. Effect on ADL: "Unable to sleep at night because pain is worse, sitting makes the numbness increase"  Timing: Constant Modifying factors: "Walking around, streches, and medicaitons to help sleep"  BP: 120/67  HR: 81  Amber Reed comes in today for a follow-up visit after her initial evaluation on 06/29/2018. Today we went over the results of her tests. These were explained in "Layman's terms". During today's appointment we went over my diagnostic impression, as well as the proposed treatment plan.  According to the patient her primary area of pain is in her groin.  Mitts his pain has been going on for approximately 8 months.  She denies any previous accident or injury.  She denies any previous surgery or interventional therapy.  She does do home stretches which are effective.  MRI of left  hip  Her second area of pain is in her hip.  She denies any previous surgery, interventional therapy or  physical therapy.  She has had recent images of x-rays and MRI.  Her third area of pain is in her lower back.  She denies any previous surgery.  She has had epidural steroid injection by Dr. Sharlet Salina which was effective.  She performs home exercises.  She has not had a recent MRI.Marland Kitchen  Her last area of pain is in her legs.  She admits that the left is greater than the right.  She has numbness in the fronts of her legs but feels like the pain starts in the back and goes to the outer portion down to the calf occasionally to the bottom of her feet.  She denies any weakness.  She did have a nerve conduction study.  She was referred for lateral femoral cutaneous nerve block.  In considering the treatment plan options, Amber Reed was reminded that I no longer take patients for medication management only. I asked her to let me know if she had no intention of taking advantage of the interventional therapies, so that we could make arrangements to provide this space to someone interested. I also made it clear that undergoing interventional therapies for the purpose of getting pain medications is very inappropriate on the part of a patient, and it will not be tolerated in this practice. This type of behavior would suggest true addiction and therefore it requires referral to an addiction specialist.   Further details on both, my assessment(s), as well as the proposed treatment plan, please see below.  Controlled Substance Pharmacotherapy Assessment REMS (Risk Evaluation and Mitigation Strategy)  Analgesic: None Highest recorded MME/day: 30 mg/day MME/day: 0 mg/day  Pill Count: None expected due to no prior prescriptions written by our practice. Janne Napoleon, RN  07/24/2018  9:59 AM  Sign when Signing Visit Safety precautions to be maintained throughout the outpatient stay will include: orient to surroundings, keep bed in low position, maintain call bell within reach at all times, provide assistance with  transfer out of bed and ambulation.    Pharmacokinetics: Liberation and absorption (onset of action): WNL Distribution (time to peak effect): WNL Metabolism and excretion (duration of action): WNL         Pharmacodynamics: Desired effects: Analgesia: Amber Reed reports >50% benefit. Functional ability: Patient reports that medication allows her to accomplish basic ADLs Clinically meaningful improvement in function (CMIF): Sustained CMIF goals met Perceived effectiveness: Described as relatively effective, allowing for increase in activities of daily living (ADL) Undesirable effects: Side-effects or Adverse reactions: None reported Monitoring: [place] PMP: Online review of the past 53-monthperiod previously conducted. Not applicable at this point since we have not taken over the patient's medication management yet. List of other Serum/Urine Drug Screening Test(s):  No results found. List of all UDS test(s) done:  Lab Results  Component Value Date   SUMMARY FINAL 06/29/2018   Last UDS on record: Summary  Date Value Ref Range Status  06/29/2018 FINAL  Final    Comment:    ==================================================================== TOXASSURE COMP DRUG ANALYSIS,UR ==================================================================== Test                             Result       Flag       Units Drug Present not Declared for Prescription Verification   Ephedrine/Pseudoephedrine      PRESENT  UNEXPECTED   Phenylpropanolamine            PRESENT      UNEXPECTED    Source of ephedrine/pseudoephedrine is most commonly    pseudoephedrine in over-the-counter or prescription cold and    allergy medications. Phenylpropanolamine is an expected    metabolite of ephedrine/pseudoephedrine.   Naproxen                       PRESENT      UNEXPECTED   Doxylamine                     PRESENT      UNEXPECTED   Guaifenesin                    PRESENT      UNEXPECTED    Guaifenesin may be  administered as an over-the-counter or    prescription drug; it may also be present as a breakdown product    of methocarbamol. Drug Absent but Declared for Prescription Verification   Diclofenac                     Not Detected UNEXPECTED    Diclofenac, as indicated in the declared medication list, is not    always detected even when used as directed. ==================================================================== Test                      Result    Flag   Units      Ref Range   Creatinine              90               mg/dL      >=20 ==================================================================== Declared Medications:  The flagging and interpretation on this report are based on the  following declared medications.  Unexpected results may arise from  inaccuracies in the declared medications.  **Note: The testing scope of this panel does not include small to  moderate amounts of these reported medications:  Diclofenac  **Note: The testing scope of this panel does not include following  reported medications:  Azelastine  Betahistine  Cyanocobalamin  Hydrochlorothiazide  Ondansetron  Vitamin E ==================================================================== For clinical consultation, please call [phone]. ====================================================================    UDS interpretation: No unexpected findings.          Medication Assessment Form: Patient introduced to form today Treatment compliance: Treatment may start today if patient agrees with proposed plan. Evaluation of compliance is not applicable at this point Risk Assessment Profile: Aberrant behavior: See initial evaluations. None observed or detected today Comorbid factors increasing risk of overdose: See initial evaluation. No additional risks detected today Opioid risk tool (ORT) (Total Score): 1 Personal History of Substance Abuse (SUD-Substance use disorder):  Alcohol: Negative   Illegal Drugs: Negative  Rx Drugs: Negative  ORT Risk Level calculation: Low Risk Risk of substance use disorder (SUD): Low Opioid Risk Tool - 07/24/18 0958      Family History of Substance Abuse   Alcohol  Negative    Illegal Drugs  Negative    Rx Drugs  Negative      Personal History of Substance Abuse   Alcohol  Negative    Illegal Drugs  Negative    Rx Drugs  Negative      Age   Age between 65-45 years   No      History of  Preadolescent Sexual Abuse   History of Preadolescent Sexual Abuse  Negative or Female      Psychological Disease   Psychological Disease  Negative    Depression  Positive      Total Score   Opioid Risk Tool Scoring  1    Opioid Risk Interpretation  Low Risk      ORT Scoring interpretation table:  Score <3 = Low Risk for SUD  Score between 4-7 = Moderate Risk for SUD  Score >8 = High Risk for Opioid Abuse   Risk Mitigation Strategies:  Patient opioid safety counseling: No controlled substances prescribed. Patient-Prescriber Agreement (PPA): No agreement signed.  Controlled substance notification to other providers: None required. No opioid therapy.  Pharmacologic Plan: Amber Reed has voiced her wishes to stay away from opioid analgesics.             Laboratory Chemistry  Inflammation Markers (CRP: Acute Phase) (ESR: Chronic Phase) Lab Results  Component Value Date   CRP 7 06/29/2018   ESRSEDRATE 15 06/29/2018                         Rheumatology Markers No results found.  Renal Function Markers Lab Results  Component Value Date   BUN 13 06/29/2018   CREATININE 0.75 06/29/2018   BCR 17 06/29/2018   GFRAA 118 06/29/2018   GFRNONAA 102 06/29/2018                             Hepatic Function Markers Lab Results  Component Value Date   AST 15 06/29/2018   ALBUMIN 4.8 06/29/2018   ALKPHOS 68 06/29/2018                        Electrolytes Lab Results  Component Value Date   NA 139 06/29/2018   K 3.8 06/29/2018   CL 99  06/29/2018   CALCIUM 9.9 06/29/2018   MG 1.9 06/29/2018                        Neuropathy Markers Lab Results  Component Value Date   VITAMINB12 1,548 (H) 06/29/2018                        CNS Tests No results found.  Bone Pathology Markers Lab Results  Component Value Date   25OHVITD1 45 06/29/2018   25OHVITD2 <1.0 06/29/2018   25OHVITD3 44 06/29/2018                         Coagulation Parameters No results found.  Cardiovascular Markers No results found.  CA Markers No results found.  Note: Lab results reviewed.  Recent Diagnostic Imaging Review  Lumbosacral Imaging: Lumbar MR wo contrast:  Results for orders placed during the hospital encounter of 12/23/17  MR LUMBAR SPINE WO CONTRAST   Narrative CLINICAL DATA:  Low back pain with left leg numbness and throbbing for 6 months.  EXAM: MRI LUMBAR SPINE WITHOUT CONTRAST  TECHNIQUE: Multiplanar, multisequence MR imaging of the lumbar spine was performed. No intravenous contrast was administered.  COMPARISON:  None.  FINDINGS: Segmentation:  Standard.  Alignment: Minimal left convex lumbar curvature. 3 mm anterolisthesis of L5 on S1 due to bilateral L5 pars defects.  Vertebrae: No fracture, suspicious osseous lesion, or significant marrow edema.  Conus medullaris  and cauda equina: Conus extends to the L1 level. Conus and cauda equina appear normal.  Paraspinal and other soft tissues: Unremarkable.  Disc levels:  Disc desiccation from L3-4 to L5-S1. Moderate to severe disc space narrowing at L4-5.  T12-L1: Slight left facet arthrosis.  Normal disc.  No stenosis.  L1-2: Negative.  L2-3: Negative.  L3-4: Shallow central disc protrusion with annular fissure without stenosis.  L4-5: Moderate-sized left paracentral disc protrusion and mild left facet hypertrophy result in mild to moderate left lateral recess stenosis without spinal or neural foraminal stenosis. The disc protrusion posteriorly  deflects the left L5 nerve root.  L5-S1: Anterolisthesis with mild bulging of uncovered disc as well as a left foraminal pseudo disc extrusion resulting in moderate to severe left neural foraminal stenosis and potential left L5 nerve root impingement. No spinal stenosis.  IMPRESSION: 1. Bilateral L5 pars defects with grade 1 anterolisthesis. Left foraminal stenosis at L5-S1 due to a pseudo disc extrusion with potential L5 nerve root impingement. 2. L4-5 disc protrusion resulting in left lateral recess stenosis.   Electronically Signed   By: Logan Bores M.D.   On: 12/23/2017 16:42    Hip Imaging: Hip-L MR w contrast:  Results for orders placed during the hospital encounter of 06/05/18  MR HIP LEFT W CONTRAST   Narrative CLINICAL DATA:  Left hip and groin pain for several months. No known injury.  EXAM: MRI OF THE LEFT HIP WITH CONTRAST (MR Arthrogram)  TECHNIQUE: Multiplanar, multisequence MR imaging of the hip was performed immediately following contrast injection into the hip joint under fluoroscopic guidance. No intravenous contrast was administered.  COMPARISON:  None.  FINDINGS: Bones: Marrow signal is normal throughout without fracture, stress change or focal lesion. There is no avascular necrosis of the femoral heads. No subchondral cyst formation or edema is seen about either hip.  Articular cartilage and labrum  Articular cartilage:  Normal.  Labrum:  Intact.  Joint or bursal effusion  Joint effusion: The left hip is distended with contrast. No right effusion.  Bursae: Normal.  Muscles and tendons  Muscles and tendons:  Normal.  Other findings  Miscellaneous: Imaged intrapelvic contents demonstrate no acute or focal abnormality. IUD noted.  IMPRESSION: Negative for labral tear.  Normal MR arthrogram left hip.   Electronically Signed   By: Inge Rise M.D.   On: 06/05/2018 14:48    Complexity Note: Imaging results reviewed. Results  shared with Amber Reed, using Layman's terms.                         Meds   Current Outpatient Medications:  .  Betahistine HCl (BETAHISTINE DIHYDROCHLORIDE) POWD, Use 24 mg once daily, Disp: , Rfl:  .  Cyanocobalamin-Methylcobalamin 600-600 MCG SUBL, Place under the tongue., Disp: , Rfl:  .  diclofenac (CATAFLAM) 50 MG tablet, diclofenac potassium 50 mg tablet  TAKE 1 TABLET BY MOUTH EVERY 6 HOURS as needed, Disp: , Rfl:  .  hydrochlorothiazide (HYDRODIURIL) 12.5 MG tablet, Take 12.5 mg by mouth daily., Disp: , Rfl:  .  ondansetron (ZOFRAN-ODT) 8 MG disintegrating tablet, , Disp: , Rfl:  .  vitamin E 600 UNIT capsule, Take by mouth., Disp: , Rfl:  .  azelastine (ASTELIN) 0.1 % nasal spray, Place into the nose., Disp: , Rfl:  .  methocarbamol (ROBAXIN) 500 MG tablet, Take 1 tablet (500 mg total) by mouth at bedtime for 30 days., Disp: 30 tablet, Rfl: 0  ROS  Constitutional: Denies any fever or chills Gastrointestinal: No reported hemesis, hematochezia, vomiting, or acute GI distress Musculoskeletal: Denies any acute onset joint swelling, redness, loss of ROM, or weakness Neurological: No reported episodes of acute onset apraxia, aphasia, dysarthria, agnosia, amnesia, paralysis, loss of coordination, or loss of consciousness  Allergies  Amber Reed is allergic to amoxicillin.  [place]  Drug: Amber Reed  reports previous drug use. Alcohol:  has no history on file for alcohol. Tobacco:  reports that she has never smoked. She has never used smokeless tobacco. Medical:  has a past medical history of Anxiety, Endometriosis, Factor 5 Leiden mutation, heterozygous ([place]), Meniere disease, Migraine, and PCO (polycystic ovaries). Surgical: Amber Reed  has a past surgical history that includes Cesarean section; Nasal sinus surgery; Pelvic laparoscopy; and Small intestine surgery. Family: family history includes Cancer in her maternal grandmother; Diabetes in her mother; Seizures in her paternal aunt;  Stroke in her paternal grandmother.  Constitutional Exam  General appearance: Well nourished, well developed, and well hydrated. In no apparent acute distress Vitals:   07/24/18 0948  BP: 120/67  Pulse: 81  Temp: 98.4 F (36.9 C)  SpO2: 100%  Weight: 195 lb (88.5 kg)  Height: '5\' 11"'  (1.803 m)   BMI Assessment: Estimated body mass index is 27.2 kg/m as calculated from the following:   Height as of this encounter: '5\' 11"'  (1.803 m).   Weight as of this encounter: 195 lb (88.5 kg).  BMI interpretation table: BMI level Category Range association with higher incidence of chronic pain  <18 kg/m2 Underweight   18.5-24.9 kg/m2 Ideal body weight   25-29.9 kg/m2 Overweight Increased incidence by 20%  30-34.9 kg/m2 Obese (Class I) Increased incidence by 68%  35-39.9 kg/m2 Severe obesity (Class II) Increased incidence by 136%  >40 kg/m2 Extreme obesity (Class III) Increased incidence by 254%   Patient's current BMI Ideal Body weight  Body mass index is 27.2 kg/m. Ideal body weight: 70.8 kg (156 lb 1.4 oz) Adjusted ideal body weight: 77.9 kg (171 lb 10.4 oz)   BMI Readings from Last 4 Encounters:  07/24/18 27.20 kg/m  06/29/18 27.98 kg/m  02/23/18 28.31 kg/m  01/10/18 28.52 kg/m   Wt Readings from Last 4 Encounters:  07/24/18 195 lb (88.5 kg)  06/29/18 195 lb (88.5 kg)  02/23/18 203 lb (92.1 kg)  01/10/18 204 lb 8 oz (92.8 kg)  Psych/Mental status: Alert, oriented x 3 (person, place, & time)       Eyes: PERLA Respiratory: No evidence of acute respiratory distress  Cervical Spine Area Exam  Skin & Axial Inspection: No masses, redness, edema, swelling, or associated skin lesions Alignment: Symmetrical Functional ROM: Unrestricted ROM      Stability: No instability detected Muscle Tone/Strength: Functionally intact. No obvious neuro-muscular anomalies detected. Sensory (Neurological): Unimpaired Palpation: No palpable anomalies              Upper Extremity (UE) Exam     Side: Right upper extremity  Side: Left upper extremity  Skin & Extremity Inspection: Skin color, temperature, and hair growth are WNL. No peripheral edema or cyanosis. No masses, redness, swelling, asymmetry, or associated skin lesions. No contractures.  Skin & Extremity Inspection: Skin color, temperature, and hair growth are WNL. No peripheral edema or cyanosis. No masses, redness, swelling, asymmetry, or associated skin lesions. No contractures.  Functional ROM: Unrestricted ROM          Functional ROM: Unrestricted ROM          Muscle  Tone/Strength: Functionally intact. No obvious neuro-muscular anomalies detected.  Muscle Tone/Strength: Functionally intact. No obvious neuro-muscular anomalies detected.  Sensory (Neurological): Unimpaired          Sensory (Neurological): Unimpaired          Palpation: No palpable anomalies              Palpation: No palpable anomalies              Provocative Test(s):  Phalen's test: deferred Tinel's test: deferred Apley's scratch test (touch opposite shoulder):  Action 1 (Across chest): deferred Action 2 (Overhead): deferred Action 3 (LB reach): deferred   Provocative Test(s):  Phalen's test: deferred Tinel's test: deferred Apley's scratch test (touch opposite shoulder):  Action 1 (Across chest): deferred Action 2 (Overhead): deferred Action 3 (LB reach): deferred    Thoracic Spine Area Exam  Skin & Axial Inspection: No masses, redness, or swelling Alignment: Symmetrical Functional ROM: Unrestricted ROM Stability: No instability detected Muscle Tone/Strength: Functionally intact. No obvious neuro-muscular anomalies detected. Sensory (Neurological): Unimpaired Muscle strength & Tone: No palpable anomalies  Lumbar Spine Area Exam  Skin & Axial Inspection: No masses, redness, or swelling Alignment: Symmetrical Functional ROM: Unrestricted ROM       Stability: No instability detected Muscle Tone/Strength: Functionally intact. No obvious  neuro-muscular anomalies detected. Sensory (Neurological): Unimpaired Palpation: Complains of area being tender to palpation       Provocative Tests: Hyperextension/rotation test: (+) bilaterally for facet joint pain. Lumbar quadrant test (Kemp's test): (+) bilaterally for facet joint pain. Lateral bending test: deferred today       Patrick's Maneuver: (+) for right-sided S-I arthralgia and for left hip arthralgia FABER* test: (+) for right-sided S-I arthralgia and for left hip arthralgia S-I anterior distraction/compression test: deferred today         S-I lateral compression test: deferred today         S-I Thigh-thrust test: deferred today         S-I Gaenslen's test: deferred today         *(Flexion, ABduction and External Rotation)  Gait & Posture Assessment  Ambulation: Unassisted Gait: Relatively normal for age and body habitus Posture: WNL   Lower Extremity Exam    Side: Right lower extremity  Side: Left lower extremity  Stability: No instability observed          Stability: No instability observed          Skin & Extremity Inspection: Skin color, temperature, and hair growth are WNL. No peripheral edema or cyanosis. No masses, redness, swelling, asymmetry, or associated skin lesions. No contractures.  Skin & Extremity Inspection: Skin color, temperature, and hair growth are WNL. No peripheral edema or cyanosis. No masses, redness, swelling, asymmetry, or associated skin lesions. No contractures.  Functional ROM: Unrestricted ROM for hip and knee joints          Functional ROM: Unrestricted ROM for hip and knee joints          Muscle Tone/Strength: Functionally intact. No obvious neuro-muscular anomalies detected.  Muscle Tone/Strength: Functionally intact. No obvious neuro-muscular anomalies detected.  Sensory (Neurological): Articular pain pattern        Sensory (Neurological): Articular pain pattern        DTR: Patellar: deferred today Achilles: deferred today Plantar:  deferred today  DTR: Patellar: deferred today Achilles: deferred today Plantar: deferred today  Palpation: No palpable anomalies  Palpation: No palpable anomalies   Assessment & Plan  Primary Diagnosis & Pertinent  Problem List: The primary encounter diagnosis was Chronic pain syndrome. Diagnoses of Chronic groin pain (Primary Area of Pain) (Left), Chronic hip pain (Secondary Area of Pain) (Left), Chronic sacroiliac joint pain (Tertiary Area of Pain) (Right), Chronic low back pain (Fourth Area of Pain) (Bilateral) (L>R) w/ sciatica (Bilateral), Chronic lower extremity pain (Bilateral) (L>R), Spondylolisthesis at L5-S1 level, Spondylosis, DDD (degenerative disc disease), cervical, Abnormal MRI, lumbar spine (12/23/2017), Lumbar facet arthropathy, Lumbar facet joint syndrome, Grade 1 Anterolisthesis L5/S1, Lumbar lateral recess stenosis, Lumbar foraminal stenosis, Lumbar nerve root impingement (L5) (Left), Lumbar pars defect (L5) (Bilateral), Lumbar L5 pars defect w/ spondylolisthesis, and Chronic musculoskeletal pain were also pertinent to this visit.  Visit Diagnosis: 1. Chronic pain syndrome   2. Chronic groin pain (Primary Area of Pain) (Left)   3. Chronic hip pain (Secondary Area of Pain) (Left)   4. Chronic sacroiliac joint pain (Tertiary Area of Pain) (Right)   5. Chronic low back pain (Fourth Area of Pain) (Bilateral) (L>R) w/ sciatica (Bilateral)   6. Chronic lower extremity pain (Bilateral) (L>R)   7. Spondylolisthesis at L5-S1 level   8. Spondylosis   9. DDD (degenerative disc disease), cervical   10. Abnormal MRI, lumbar spine (12/23/2017)   11. Lumbar facet arthropathy   12. Lumbar facet joint syndrome   13. Grade 1 Anterolisthesis L5/S1   14. Lumbar lateral recess stenosis   15. Lumbar foraminal stenosis   16. Lumbar nerve root impingement (L5) (Left)   17. Lumbar pars defect (L5) (Bilateral)   18. Lumbar L5 pars defect w/ spondylolisthesis   19. Chronic musculoskeletal pain     Problems updated and reviewed during this visit: Problem  Abnormal MRI, lumbar spine (12/23/2017)   Disc levels: Disc desiccation from L3-4 to L5-S1. Severe disc space narrowing at L4-5. T12-L1: left facet arthrosis.Marland Kitchen L3-4: Shallow central disc protrusion with annular fissure without stenosis. L4-5: Moderate-sized left paracentral disc protrusion and mild left facet hypertrophy result in mild to moderate left lateral recess stenosis without spinal or neural foraminal stenosis. The disc protrusion posteriorly deflects the left L5 nerve root. L5-S1: Anterolisthesis with mild bulging of uncovered disc as well as a left foraminal pseudo disc extrusion resulting in moderate to severe left neural foraminal stenosis and potential left L5 nerve root impingement. No spinal stenosis.  IMPRESSION: 1. Bilateral L5 pars defects with grade 1 anterolisthesis. Left foraminal stenosis at L5-S1 due to a pseudo disc extrusion with potential L5 nerve root impingement. 2. L4-5 disc protrusion resulting in left lateral recess stenosis.   Lumbar Facet Arthropathy   T12-L1: left facet arthrosis L4-5: left facet hypertrophy L5-S1: Anterolisthesis   Lumbar Facet Joint Syndrome  Grade 1 Anterolisthesis L5/S1   Bilateral L5 pars defects with grade 1 anterolisthesis. Left foraminal stenosis at L5-S1 due to a pseudo disc extrusion with potential L5 nerve root impingement.   Lumbar lateral recess stenosis   L4-5: Moderate-sized left paracentral disc protrusion and mild left facet hypertrophy result in mild to moderate left lateral recess stenosis. The disc protrusion posteriorly deflects the left L5 nerve root.   Lumbar Foraminal Stenosis   L5-S1: Anterolisthesis with mild bulging of uncovered disc as well as a left foraminal pseudo disc extrusion resulting in moderate to severe left neural foraminal stenosis and potential left L5 nerve root impingement.   Lumbar nerve root impingement (L5) (Left)   L4-5:  Moderate-sized left paracentral disc protrusion and mild left facet hypertrophy result in mild to moderate left lateral recess stenosis without spinal or neural  foraminal stenosis. The disc protrusion posteriorly deflects the left L5 nerve root. L5-S1: Anterolisthesis with mild bulging of uncovered disc as well as a left foraminal pseudo disc extrusion resulting in moderate to severe left neural foraminal stenosis and potential left L5 nerve root impingement. No spinal stenosis.  IMPRESSION: 1. Bilateral L5 pars defects with grade 1 anterolisthesis. Left foraminal stenosis at L5-S1 due to a pseudo disc extrusion with potential L5 nerve root impingement. 2. L4-5 disc protrusion resulting in left lateral recess stenosis.   Lumbar pars defect (L5) (Bilateral)   Bilateral L5 pars defects with grade 1 anterolisthesis. Left foraminal stenosis at L5-S1 due to a pseudo disc extrusion with potential L5 nerve root impingement.   Lumbar L5 pars defect w/ spondylolisthesis   Bilateral L5 pars defects with grade 1 anterolisthesis. Left foraminal stenosis at L5-S1 due to a pseudo disc extrusion with potential L5 nerve root impingement.   Chronic Musculoskeletal Pain  Chronic sacroiliac joint pain (Tertiary Area of Pain) (Right)  Spondylosis  Chronic groin pain (Primary Area of Pain) (Left)  Chronic hip pain (Secondary Area of Pain) (Left)  Chronic low back pain (Fourth Area of Pain) (Bilateral) (L>R) w/ sciatica (Bilateral)  Chronic lower extremity pain (Bilateral) (L>R)   LLEP - Down to bottom (S1) runs thru back of leg and turns anterior (Dermatomal Pattern). RLEP - To the outer knee on the lateral and anterior thigh.   Chronic Pain Syndrome  Ddd (Degenerative Disc Disease), Cervical   Last Assessment & Plan:  ROM exercises, pain control, body stature   Migraines   Last Assessment & Plan:  Refer to neuro, check labs as well   Spondylolisthesis At L5-S1 Level  Family History of Factor V Leiden  Mutation  Lyme Disease  Pharmacologic Therapy  Disorder of Skeletal System  Problems Influencing Health Status  Endometriosis  Meniere's Disease of Right Ear  Insomnia   Last Assessment & Plan:  Refer to neuro for sleep study   Snoring   Last Assessment & Plan:  Refer to neuro for sleep study   Gad (Generalized Anxiety Disorder)  Major Depression  Vaginal Infection  Allergic Rhinitis Due to Animal Hair and Dander  Dysfunction of Right Eustachian Tube  Hearing Loss, Sensorineural, Asymmetrical  Vestibular Dizziness    Plan of Care  Pharmacotherapy (Medications Ordered): Meds ordered this encounter  Medications  . methocarbamol (ROBAXIN) 500 MG tablet    Sig: Take 1 tablet (500 mg total) by mouth at bedtime for 30 days.    Dispense:  30 tablet    Refill:  0    Do not place this medication, or any other prescription from our practice, on "Automatic Refill". Patient may have prescription filled one day early if pharmacy is closed on scheduled refill date.    Procedure Orders     HIP INJECTION Lab Orders  No laboratory test(s) ordered today   Imaging Orders  No imaging studies ordered today    Referral Orders     Ambulatory referral to Physical Therapy  Pharmacological management options:  Opioid Analgesics: Amber Reed would like to stay away from opioids, if at all possible Membrane stabilizer: Options discussed, Amber Reed would prefer to avaoid taking any Muscle relaxant: We have discussed the possibility of a trial.  I have provided her with a prescription for Robaxin 500 mg p.o. at bedtime. NSAID: We have discussed the possibility of a trial.  Recommendations given. Other analgesic(s): None prescribed at this time.   Interventional management options: Planned, scheduled,  and/or pending:    Diagnostic left intra-articular hip joint injection #1    Considering:   Diagnostic left intra-articular hip joint injection  Diagnostic left femoral nerve block +  obturator nerve block  Possible left femoral nerve + obturator nerve RFA  Diagnostic left lateral femoral cutaneous nerve block  Diagnostic left sacroiliac joint injection  Possible left sacroiliac RFA   Diagnostic left L4-5 interlaminar LESI  Diagnostic left L4 transforaminal ESI  Diagnostic left L5-S1 LESI  Diagnostic bilateral vs left L5 transforaminal ESI  Diagnostic bilateral lumbar facet blocks  Possible bilateral lumbar facet RFA    PRN Procedures:   None at this time   Provider-requested follow-up: Return for Procedure (no sedation): (L) IA Hip inj. #1.  No future appointments.  Primary Care Physician: Elisabeth Most, MD Location: [facility] Note by: Gaspar Cola, MD Date: 07/24/2018; Time: 11:51 AM

## 2018-07-24 ENCOUNTER — Encounter: Payer: Self-pay | Admitting: Pain Medicine

## 2018-07-24 ENCOUNTER — Ambulatory Visit: Payer: Managed Care, Other (non HMO) | Attending: Pain Medicine | Admitting: Pain Medicine

## 2018-07-24 ENCOUNTER — Other Ambulatory Visit: Payer: Self-pay

## 2018-07-24 VITALS — BP 120/67 | HR 81 | Temp 98.4°F | Ht 71.0 in | Wt 195.0 lb

## 2018-07-24 DIAGNOSIS — M48061 Spinal stenosis, lumbar region without neurogenic claudication: Secondary | ICD-10-CM | POA: Insufficient documentation

## 2018-07-24 DIAGNOSIS — G8929 Other chronic pain: Secondary | ICD-10-CM | POA: Diagnosis present

## 2018-07-24 DIAGNOSIS — M5030 Other cervical disc degeneration, unspecified cervical region: Secondary | ICD-10-CM

## 2018-07-24 DIAGNOSIS — M4306 Spondylolysis, lumbar region: Secondary | ICD-10-CM | POA: Diagnosis present

## 2018-07-24 DIAGNOSIS — R937 Abnormal findings on diagnostic imaging of other parts of musculoskeletal system: Secondary | ICD-10-CM | POA: Diagnosis present

## 2018-07-24 DIAGNOSIS — M47816 Spondylosis without myelopathy or radiculopathy, lumbar region: Secondary | ICD-10-CM | POA: Insufficient documentation

## 2018-07-24 DIAGNOSIS — M479 Spondylosis, unspecified: Secondary | ICD-10-CM

## 2018-07-24 DIAGNOSIS — R1032 Left lower quadrant pain: Secondary | ICD-10-CM

## 2018-07-24 DIAGNOSIS — M5441 Lumbago with sciatica, right side: Secondary | ICD-10-CM

## 2018-07-24 DIAGNOSIS — M4310 Spondylolisthesis, site unspecified: Secondary | ICD-10-CM | POA: Insufficient documentation

## 2018-07-24 DIAGNOSIS — M4300 Spondylolysis, site unspecified: Secondary | ICD-10-CM | POA: Diagnosis present

## 2018-07-24 DIAGNOSIS — M79605 Pain in left leg: Secondary | ICD-10-CM | POA: Diagnosis present

## 2018-07-24 DIAGNOSIS — M4317 Spondylolisthesis, lumbosacral region: Secondary | ICD-10-CM

## 2018-07-24 DIAGNOSIS — M79604 Pain in right leg: Secondary | ICD-10-CM

## 2018-07-24 DIAGNOSIS — M25552 Pain in left hip: Secondary | ICD-10-CM | POA: Diagnosis not present

## 2018-07-24 DIAGNOSIS — G894 Chronic pain syndrome: Secondary | ICD-10-CM | POA: Diagnosis not present

## 2018-07-24 DIAGNOSIS — M5442 Lumbago with sciatica, left side: Secondary | ICD-10-CM

## 2018-07-24 DIAGNOSIS — M7918 Myalgia, other site: Secondary | ICD-10-CM | POA: Insufficient documentation

## 2018-07-24 DIAGNOSIS — M5416 Radiculopathy, lumbar region: Secondary | ICD-10-CM | POA: Insufficient documentation

## 2018-07-24 DIAGNOSIS — M533 Sacrococcygeal disorders, not elsewhere classified: Secondary | ICD-10-CM | POA: Diagnosis not present

## 2018-07-24 MED ORDER — METHOCARBAMOL 500 MG PO TABS: 500.0000 mg | ORAL_TABLET | Freq: Every day | ORAL | 0 refills | Status: DC

## 2018-07-24 NOTE — Patient Instructions (Addendum)
____________________________________________________________________________________________  Pain Prevention Technique  Definition:   A technique used to minimize the effects of an activity known to cause inflammation or swelling, which in turn leads to an increase in pain.  Purpose: To prevent swelling from occurring. It is based on the fact that it is easier to prevent swelling from happening than it is to get rid of it, once it occurs.  Contraindications: 1. Anyone with allergy or hypersensitivity to the recommended medications. 2. Anyone taking anticoagulants (Blood Thinners) (e.g., Coumadin, Warfarin, Plavix, etc.). 3. Patients in Renal Failure.  Technique: Before you undertake an activity known to cause pain, or a flare-up of your chronic pain, and before you experience any pain, do the following:  1. On a full stomach, take 4 (four) over the counter Ibuprofens 200mg  tablets (Motrin), for a total of 800 mg. 2. In addition, take over the counter Magnesium 400 to 500 mg, before doing the activity.  3. Six (6) hours later, again on a full stomach, repeat the Ibuprofen. 4. That night, take a warm shower and stretch under the running warm water.  This technique may be sufficient to abort the pain and discomfort before it happens. Keep in mind that it takes a lot less medication to prevent swelling than it takes to eliminate it once it occurs.  ____________________________________________________________________________________________   ____________________________________________________________________________________________  Preparing for your procedure (without sedation)  Instructions: . Oral Intake: Do not eat or drink anything for at least 3 hours prior to your procedure. . Transportation: Unless otherwise stated by your physician, you may drive yourself after the procedure. . Blood Pressure Medicine: Take your blood pressure medicine with a sip of water the morning of the  procedure. . Blood thinners: Notify our staff if you are taking any blood thinners. Depending on which one you take, there will be specific instructions on how and when to stop it. . Diabetics on insulin: Notify the staff so that you can be scheduled 1st case in the morning. If your diabetes requires high dose insulin, take only  of your normal insulin dose the morning of the procedure and notify the staff that you have done so. . Preventing infections: Shower with an antibacterial soap the morning of your procedure.  . Build-up your immune system: Take 1000 mg of Vitamin C with every meal (3 times a day) the day prior to your procedure. Marland Kitchen Antibiotics: Inform the staff if you have a condition or reason that requires you to take antibiotics before dental procedures. . Pregnancy: If you are pregnant, call and cancel the procedure. . Sickness: If you have a cold, fever, or any active infections, call and cancel the procedure. . Arrival: You must be in the facility at least 30 minutes prior to your scheduled procedure. . Children: Do not bring any children with you. . Dress appropriately: Bring dark clothing that you would not mind if they get stained. . Valuables: Do not bring any jewelry or valuables.  Procedure appointments are reserved for interventional treatments only. Marland Kitchen No Prescription Refills. . No medication changes will be discussed during procedure appointments. . No disability issues will be discussed.  Reasons to call and reschedule or cancel your procedure: (Following these recommendations will minimize the risk of a serious complication.) . Surgeries: Avoid having procedures within 2 weeks of any surgery. (Avoid for 2 weeks before or after any surgery). . Flu Shots: Avoid having procedures within 2 weeks of a flu shots or . (Avoid for 2 weeks before or after  immunizations). . Barium: Avoid having a procedure within 7-10 days after having had a radiological study involving the use of  radiological contrast. (Myelograms, Barium swallow or enema study). . Heart attacks: Avoid any elective procedures or surgeries for the initial 6 months after a "Myocardial Infarction" (Heart Attack). . Blood thinners: It is imperative that you stop these medications before procedures. Let us know if you if you take any blood thinner.  . Infection: Avoid procedures during or within two weeks of an infection (including chest colds or gastrointestinal problems). Symptoms associated with infections include: Localized redness, fever, chills, night sweats or profuse sweating, burning sensation when voiding, cough, congestion, stuffiness, runny nose, sore throat, diarrhea, nausea, vomiting, cold or Flu symptoms, recent or current infections. It is specially important if the infection is over the area that we intend to treat. Marland Kitchen Heart and lung problems: Symptoms that may suggest an active cardiopulmonary problem include: cough, chest pain, breathing difficulties or shortness of breath, dizziness, ankle swelling, uncontrolled high or unusually low blood pressure, and/or palpitations. If you are experiencing any of these symptoms, cancel your procedure and contact your primary care physician for an evaluation.  Remember:  Regular Business hours are:  Monday to Thursday 8:00 AM to 4:00 PM  Provider's Schedule: Delano Metz, MD:  Procedure days: Tuesday and Thursday 7:30 AM to 4:00 PM  Edward Jolly, MD:  Procedure days: Monday and Wednesday 7:30 AM to 4:00 PM ____________________________________________________________________________________________   ____________________________________________________________________________________________  General Risks and Possible Complications  Patient Responsibilities: It is important that you read this as it is part of your informed consent. It is our duty to inform you of the risks and possible complications associated with treatments offered to you. It is  your responsibility as a patient to read this and to ask questions about anything that is not clear or that you believe was not covered in this document.  Patient's Rights: You have the right to refuse treatment. You also have the right to change your mind, even after initially having agreed to have the treatment done. However, under this last option, if you wait until the last second to change your mind, you may be charged for the materials used up to that point.  Introduction: Medicine is not an Visual merchandiser. Everything in Medicine, including the lack of treatment(s), carries the potential for danger, harm, or loss (which is by definition: Risk). In Medicine, a complication is a secondary problem, condition, or disease that can aggravate an already existing one. All treatments carry the risk of possible complications. The fact that a side effects or complications occurs, does not imply that the treatment was conducted incorrectly. It must be clearly understood that these can happen even when everything is done following the highest safety standards.  No treatment: You can choose not to proceed with the proposed treatment alternative. The "PRO(s)" would include: avoiding the risk of complications associated with the therapy. The "CON(s)" would include: not getting any of the treatment benefits. These benefits fall under one of three categories: diagnostic; therapeutic; and/or palliative. Diagnostic benefits include: getting information which can ultimately lead to improvement of the disease or symptom(s). Therapeutic benefits are those associated with the successful treatment of the disease. Finally, palliative benefits are those related to the decrease of the primary symptoms, without necessarily curing the condition (example: decreasing the pain from a flare-up of a chronic condition, such as incurable terminal cancer).  General Risks and Complications: These are associated to most interventional  treatments. They  can occur alone, or in combination. They fall under one of the following six (6) categories: no benefit or worsening of symptoms; bleeding; infection; nerve damage; allergic reactions; and/or death. 1. No benefits or worsening of symptoms: In Medicine there are no guarantees, only probabilities. No healthcare provider can ever guarantee that a medical treatment will work, they can only state the probability that it may. Furthermore, there is always the possibility that the condition may worsen, either directly, or indirectly, as a consequence of the treatment. 2. Bleeding: This is more common if the patient is taking a blood thinner, either prescription or over the counter (example: Goody Powders, Fish oil, Aspirin, Garlic, etc.), or if suffering a condition associated with impaired coagulation (example: Hemophilia, cirrhosis of the liver, low platelet counts, etc.). However, even if you do not have one on these, it can still happen. If you have any of these conditions, or take one of these drugs, make sure to notify your treating physician. 3. Infection: This is more common in patients with a compromised immune system, either due to disease (example: diabetes, cancer, human immunodeficiency virus [HIV], etc.), or due to medications or treatments (example: therapies used to treat cancer and rheumatological diseases). However, even if you do not have one on these, it can still happen. If you have any of these conditions, or take one of these drugs, make sure to notify your treating physician. 4. Nerve Damage: This is more common when the treatment is an invasive one, but it can also happen with the use of medications, such as those used in the treatment of cancer. The damage can occur to small secondary nerves, or to large primary ones, such as those in the spinal cord and brain. This damage may be temporary or permanent and it may lead to impairments that can range from temporary numbness to  permanent paralysis and/or brain death. 5. Allergic Reactions: Any time a substance or material comes in contact with our body, there is the possibility of an allergic reaction. These can range from a mild skin rash (contact dermatitis) to a severe systemic reaction (anaphylactic reaction), which can result in death. 6. Death: In general, any medical intervention can result in death, most of the time due to an unforeseen complication. ____________________________________________________________________________________________

## 2018-07-24 NOTE — Progress Notes (Signed)
Safety precautions to be maintained throughout the outpatient stay will include: orient to surroundings, keep bed in low position, maintain call bell within reach at all times, provide assistance with transfer out of bed and ambulation.  

## 2018-08-08 ENCOUNTER — Ambulatory Visit (HOSPITAL_BASED_OUTPATIENT_CLINIC_OR_DEPARTMENT_OTHER): Payer: 59 | Admitting: Pain Medicine

## 2018-08-08 ENCOUNTER — Encounter: Payer: Self-pay | Admitting: Pain Medicine

## 2018-08-08 ENCOUNTER — Ambulatory Visit
Admission: RE | Admit: 2018-08-08 | Discharge: 2018-08-08 | Disposition: A | Discharge status: Home / Self Care | Payer: 59 | Source: Ambulatory Visit | Attending: Pain Medicine | Admitting: Pain Medicine

## 2018-08-08 ENCOUNTER — Other Ambulatory Visit: Payer: Self-pay

## 2018-08-08 VITALS — BP 96/57 | HR 88 | Temp 98.4°F | Resp 15 | Ht 71.0 in | Wt 195.0 lb

## 2018-08-08 DIAGNOSIS — G8929 Other chronic pain: Secondary | ICD-10-CM | POA: Diagnosis present

## 2018-08-08 DIAGNOSIS — M1612 Unilateral primary osteoarthritis, left hip: Secondary | ICD-10-CM

## 2018-08-08 DIAGNOSIS — M25552 Pain in left hip: Secondary | ICD-10-CM | POA: Insufficient documentation

## 2018-08-08 MED ORDER — METHYLPREDNISOLONE ACETATE 80 MG/ML IJ SUSP
80.0000 mg | Freq: Once | INTRAMUSCULAR | Status: AC
  Administered 2018-08-08: 80 mg via INTRA_ARTICULAR
  Filled 2018-08-08: qty 1

## 2018-08-08 MED ORDER — ROPIVACAINE HCL 2 MG/ML IJ SOLN
4.0000 mL | Freq: Once | INTRAMUSCULAR | Status: AC
  Administered 2018-08-08: 4 mL via INTRA_ARTICULAR
  Filled 2018-08-08: qty 10

## 2018-08-08 MED ORDER — IOPAMIDOL (ISOVUE-M 200) INJECTION 41%
10.0000 mL | Freq: Once | INTRAMUSCULAR | Status: AC
  Administered 2018-08-08: 10 mL via EPIDURAL
  Filled 2018-08-08: qty 10

## 2018-08-08 MED ORDER — LIDOCAINE HCL 2 % IJ SOLN
20.0000 mL | Freq: Once | INTRAMUSCULAR | Status: AC
  Administered 2018-08-08: 400 mg
  Filled 2018-08-08: qty 40

## 2018-08-08 NOTE — Patient Instructions (Addendum)
____________________________________________________________________________________________  Post-Procedure Discharge Instructions  Instructions:  Apply ice: Fill a plastic sandwich bag with crushed ice. Cover it with a small towel and apply to injection site. Apply for 15 minutes then remove x 15 minutes. Repeat sequence on day of procedure, until you go to bed. The purpose is to minimize swelling and discomfort after procedure.  Apply heat: Apply heat to procedure site starting the day following the procedure. The purpose is to treat any soreness and discomfort from the procedure.  Food intake: Start with clear liquids (like water) and advance to regular food, as tolerated.   Physical activities: Keep activities to a minimum for the first 8 hours after the procedure.   Driving: If you have received any sedation, you are not allowed to drive for 24 hours after your procedure.  Blood thinner: Restart your blood thinner 6 hours after your procedure. (Only for those taking blood thinners)  Insulin: As soon as you can eat, you may resume your normal dosing schedule. (Only for those taking insulin)  Infection prevention: Keep procedure site clean and dry.  Post-procedure Pain Diary: Extremely important that this be done correctly and accurately. Recorded information will be used to determine the next step in treatment.  Pain evaluated is that of treated area only. Do not include pain from an untreated area.  Complete every hour, on the hour, for the initial 8 hours. Set an alarm to help you do this part accurately.  Do not go to sleep and have it completed later. It will not be accurate.  Follow-up appointment: Keep your follow-up appointment after the procedure. Usually 2 weeks for most procedures. (6 weeks in the case of radiofrequency.) Bring you pain diary.   Expect:  From numbing medicine (AKA: Local Anesthetics): Numbness or decrease in pain.  Onset: Full effect within 15  minutes of injected.  Duration: It will depend on the type of local anesthetic used. On the average, 1 to 8 hours.   From steroids: Decrease in swelling or inflammation. Once inflammation is improved, relief of the pain will follow.  Onset of benefits: Depends on the amount of swelling present. The more swelling, the longer it will take for the benefits to be seen. In some cases, up to 10 days.  Duration: Steroids will stay in the system x 2 weeks. Duration of benefits will depend on multiple posibilities including persistent irritating factors.  Occasional side-effects: Facial flushing (red, warm cheeks) , cramps (if present, drink Gatorade and take over-the-counter Magnesium 450-500 mg once to twice a day).  From procedure: Some discomfort is to be expected once the numbing medicine wears off. This should be minimal if ice and heat are applied as instructed.  Call if:  You experience numbness and weakness that gets worse with time, as opposed to wearing off.  New onset bowel or bladder incontinence. (This applies to Spinal procedures only)  Emergency Numbers:  Durning business hours (Monday - Thursday, 8:00 AM - 4:00 PM) (Friday, 9:00 AM - 12:00 Noon): [phone]  After hours: [phone] ____________________________________________________________________________________________   Pain Management Discharge Instructions  General Discharge Instructions :  If you need to reach your doctor call: Monday-Friday 8:00 am - 4:00 pm at [phone] or toll free [phone].  After clinic hours [phone] to have operator reach doctor.  Bring all of your medication bottles to all your appointments in the pain clinic.  To cancel or reschedule your appointment with Pain Management please remember to call 24 hours in advance to   avoid a fee.  Refer to the educational materials which you have been given on: General Risks, I had my Procedure. Discharge Instructions, Post  Sedation.  Post Procedure Instructions:  The drugs you were given will stay in your system until tomorrow, so for the next 24 hours you should not drive, make any legal decisions or drink any alcoholic beverages.  You may eat anything you prefer, but it is better to start with liquids then soups and crackers, and gradually work up to solid foods.  Please notify your doctor immediately if you have any unusual bleeding, trouble breathing or pain that is not related to your normal pain.  Depending on the type of procedure that was done, some parts of your body may feel week and/or numb.  This usually clears up by tonight or the next day.  Walk with the use of an assistive device or accompanied by an adult for the 24 hours.  You may use ice on the affected area for the first 24 hours.  Put ice in a Ziploc bag and cover with a towel and place against area 15 minutes on 15 minutes off.  You may switch to heat after 24 hours. 

## 2018-08-08 NOTE — Progress Notes (Signed)
Patient's Name: Amber Reed  MRN: [id number]  Referring Provider: Delano Metz, MD  DOB: 03-03-1981  PCP: Yates Decamp, MD  DOS: 08/08/2018  Note by: Oswaldo Done, MD  Service setting: Ambulatory outpatient  Specialty: Interventional Pain Management  Patient type: Established  Location: [facility] (AMB) Pain Management Facility  Visit type: Interventional Procedure   Primary Reason for Visit: Interventional Pain Management Treatment. CC: Hip Pain (left)  Procedure:          Anesthesia, Analgesia, Anxiolysis:  Type: Intra-Articular Hip Injection #1  Primary Purpose: Diagnostic Region: Posterolateral hip joint area. Level: Lower pelvic and hip joint level. Target Area: Superior aspect of the hip joint cavity, going thru the superior portion of the capsular ligament. Approach: Posterolateral approach. Laterality: Left-Sided  Type: Local Anesthesia Indication(s): Analgesia         Route: Infiltration ([place]/IM) IV Access: Declined Sedation: Declined  Local Anesthetic: Lidocaine 1-2%  Position: Lateral Decubitus with bad side up Prepped Area: Entire Posterolateral hip area. Prepping solution: ChloraPrep (2% chlorhexidine gluconate and 70% isopropyl alcohol)   Indications: 1. Osteoarthritis of hip (Left)   2. Chronic hip pain (Secondary Area of Pain) (Left)    Pain Score: Pre-procedure: 3 /10 Post-procedure: 3 /10  Pre-op Assessment:  Amber Reed is a 38 y.o. (year old), female patient, seen today for interventional treatment. She  has a past surgical history that includes Cesarean section; Nasal sinus surgery; Pelvic laparoscopy; and Small intestine surgery. Amber Reed has a current medication list which includes the following prescription(s): betahistine dihydrochloride, cyanocobalamin-methylcobalamin, diclofenac, hydrochlorothiazide, methocarbamol, ondansetron, vitamin e, and azelastine. Her primarily concern today is the Hip Pain (left)  Initial Vital Signs:   Pulse/HCG Rate: 88ECG Heart Rate: 77 Temp: 98.4 F (36.9 C) Resp: 16 BP: 119/65 SpO2: 99 %  BMI: Estimated body mass index is 27.2 kg/m as calculated from the following:   Height as of this encounter: 5\' 11"  (1.803 m).   Weight as of this encounter: 195 lb (88.5 kg).  Risk Assessment: Allergies: Reviewed. She is allergic to other and amoxicillin.  Allergy Precautions: None required Coagulopathies: Reviewed. None identified.  Blood-thinner therapy: None at this time Active Infection(s): Reviewed. None identified. Amber Reed is afebrile  Site Confirmation: Amber Reed was asked to confirm the procedure and laterality before marking the site Procedure checklist: Completed Consent: Before the procedure and under the influence of no sedative(s), amnesic(s), or anxiolytics, the patient was informed of the treatment options, risks and possible complications. To fulfill our ethical and legal obligations, as recommended by the American Medical Association's Code of Ethics, I have informed the patient of my clinical impression; the nature and purpose of the treatment or procedure; the risks, benefits, and possible complications of the intervention; the alternatives, including doing nothing; the risk(s) and benefit(s) of the alternative treatment(s) or procedure(s); and the risk(s) and benefit(s) of doing nothing. The patient was provided information about the general risks and possible complications associated with the procedure. These may include, but are not limited to: failure to achieve desired goals, infection, bleeding, organ or nerve damage, allergic reactions, paralysis, and death. In addition, the patient was informed of those risks and complications associated to the procedure, such as failure to decrease pain; infection; bleeding; organ or nerve damage with subsequent damage to sensory, motor, and/or autonomic systems, resulting in permanent pain, numbness, and/or weakness of one or several  areas of the body; allergic reactions; (i.e.: anaphylactic reaction); and/or death. Furthermore, the patient was informed of those  risks and complications associated with the medications. These include, but are not limited to: allergic reactions (i.e.: anaphylactic or anaphylactoid reaction(s)); adrenal axis suppression; blood sugar elevation that in diabetics may result in ketoacidosis or comma; water retention that in patients with history of congestive heart failure may result in shortness of breath, pulmonary edema, and decompensation with resultant heart failure; weight gain; swelling or edema; medication-induced neural toxicity; particulate matter embolism and blood vessel occlusion with resultant organ, and/or nervous system infarction; and/or aseptic necrosis of one or more joints. Finally, the patient was informed that Medicine is not an exact science; therefore, there is also the possibility of unforeseen or unpredictable risks and/or possible complications that may result in a catastrophic outcome. The patient indicated having understood very clearly. We have given the patient no guarantees and we have made no promises. Enough time was given to the patient to ask questions, all of which were answered to the patient's satisfaction. Amber Reed has indicated that she wanted to continue with the procedure. Attestation: I, the ordering provider, attest that I have discussed with the patient the benefits, risks, side-effects, alternatives, likelihood of achieving goals, and potential problems during recovery for the procedure that I have provided informed consent. Date  Time: 08/08/2018  9:15 AM  Pre-Procedure Preparation:  Monitoring: As per clinic protocol. Respiration, ETCO2, SpO2, BP, heart rate and rhythm monitor placed and checked for adequate function Safety Precautions: Patient was assessed for positional comfort and pressure points before starting the procedure. Time-out: I initiated and  conducted the "Time-out" before starting the procedure, as per protocol. The patient was asked to participate by confirming the accuracy of the "Time Out" information. Verification of the correct person, site, and procedure were performed and confirmed by me, the nursing staff, and the patient. "Time-out" conducted as per Joint Commission's Universal Protocol (UP.01.01.01). Time: 1017  Description of Procedure:          Safety Precautions: Aspiration looking for blood return was conducted prior to all injections. At no point did we inject any substances, as a needle was being advanced. No attempts were made at seeking any paresthesias. Safe injection practices and needle disposal techniques used. Medications properly checked for expiration dates. SDV (single dose vial) medications used. Description of the Procedure: Protocol guidelines were followed. The patient was placed in position over the fluoroscopy table. The target area was identified and the area prepped in the usual manner. Skin & deeper tissues infiltrated with local anesthetic. Appropriate amount of time allowed to pass for local anesthetics to take effect. The procedure needles were then advanced to the target area. Proper needle placement secured. Negative aspiration confirmed. Solution injected in intermittent fashion, asking for systemic symptoms every 0.5cc of injectate. The needles were then removed and the area cleansed, making sure to leave some of the prepping solution back to take advantage of its long term bactericidal properties. Vitals:   08/08/18 0913 08/08/18 1014 08/08/18 1019  BP: 119/65 (!) 94/54 (!) 96/57  Pulse: 88    Resp: 16 18 15   Temp: 98.4 F (36.9 C)    TempSrc: Oral    SpO2: 99% 98% 99%  Weight: 195 lb (88.5 kg)    Height: 5\' 11"  (1.803 m)      Start Time: 1017 hrs. End Time: 1019 hrs. Materials:  Needle(s) Type: Spinal Needle Gauge: 22G Length: 5.0-in Medication(s): Please see orders for medications and  dosing details.  Imaging Guidance (Non-Spinal):  Type of Imaging Technique: Fluoroscopy Guidance (Non-Spinal) Indication(s): Assistance in needle guidance and placement for procedures requiring needle placement in or near specific anatomical locations not easily accessible without such assistance. Exposure Time: Please see nurses notes. Contrast: Before injecting any contrast, we confirmed that the patient did not have an allergy to iodine, shellfish, or radiological contrast. Once satisfactory needle placement was completed at the desired level, radiological contrast was injected. Contrast injected under live fluoroscopy. No contrast complications. See chart for type and volume of contrast used. Fluoroscopic Guidance: I was personally present during the use of fluoroscopy. "Tunnel Vision Technique" used to obtain the best possible view of the target area. Parallax error corrected before commencing the procedure. "Direction-depth-direction" technique used to introduce the needle under continuous pulsed fluoroscopy. Once target was reached, antero-posterior, oblique, and lateral fluoroscopic projection used confirm needle placement in all planes. Images permanently stored in EMR. Interpretation: I personally interpreted the imaging intraoperatively. Adequate needle placement confirmed in multiple planes. Appropriate spread of contrast into desired area was observed. No evidence of afferent or efferent intravascular uptake. Permanent images saved into the patient's record.  Antibiotic Prophylaxis:   Anti-infectives (From admission, onward)   None     Indication(s): None identified  Post-operative Assessment:  Post-procedure Vital Signs:  Pulse/HCG Rate: 8880 Temp: 98.4 F (36.9 C) Resp: 15 BP: (!) 96/57 SpO2: 99 %  EBL: None  Complications: No immediate post-treatment complications observed by team, or reported by patient.  Note: The patient tolerated the entire procedure well. A  repeat set of vitals were taken after the procedure and the patient was kept under observation following institutional policy, for this type of procedure. Post-procedural neurological assessment was performed, showing return to baseline, prior to discharge. The patient was provided with post-procedure discharge instructions, including a section on how to identify potential problems. Should any problems arise concerning this procedure, the patient was given instructions to immediately contact us, at any time, without hesitation. In any case, we plan to contact the patient by telephone for a follow-up status report regarding this interventional procedure.  Comments:  No additional relevant information.  Plan of Care    Imaging Orders     DG C-Arm 1-60 Min-No Report  Procedure Orders     HIP INJECTION  Medications ordered for procedure: Meds ordered this encounter  Medications  . iopamidol (ISOVUE-M) 41 % intrathecal injection 10 mL    Must be Myelogram-compatible. If not available, you may substitute with a water-soluble, non-ionic, hypoallergenic, myelogram-compatible radiological contrast medium.  Marland Kitchen lidocaine (XYLOCAINE) 2 % (with pres) injection 400 mg  . ropivacaine (PF) 2 mg/mL (0.2%) (NAROPIN) injection 4 mL  . methylPREDNISolone acetate (DEPO-MEDROL) injection 80 mg   Medications administered: We administered iopamidol, lidocaine, ropivacaine (PF) 2 mg/mL (0.2%), and methylPREDNISolone acetate.  See the medical record for exact dosing, route, and time of administration.  Disposition: Discharge home  Discharge Date & Time: 08/08/2018; 1035 hrs.   Physician-requested Follow-up: Return for post-procedure eval (2 wks), w/ Dr. Laban Emperor.  Future Appointments  Date Time Provider Department Center  08/22/2018 10:45 AM Staci Acosta, PT [facility] None  08/23/2018 10:15 AM Delano Metz, MD [facility] None  08/24/2018  9:00 AM Staci Acosta, PT [facility] None  08/29/2018 10:30 AM  Staci Acosta, PT [facility] None  08/31/2018 10:30 AM Staci Acosta, PT [facility] None  09/05/2018 10:30 AM Staci Acosta, PT [facility] None  09/07/2018 10:30 AM Staci Acosta, PT [facility] None  09/12/2018 10:30 AM Staci Acosta, PT [facility] None  09/14/2018 10:30 AM Staci AcostaMiller, [place], PT [facility] None  09/19/2018 10:30 AM Staci AcostaMiller, [place], PT [facility] None  09/21/2018 11:15 AM Staci AcostaMiller, [place], PT [facility] None  09/26/2018 11:15 AM Staci AcostaMiller, [place], PT [facility] None  09/28/2018 11:15 AM Staci AcostaMiller, [place], PT [facility] None  10/03/2018 11:15 AM Staci AcostaMiller, [place], PT [facility] None  10/05/2018 11:15 AM Staci AcostaMiller, [place], PT [facility] None  10/10/2018 11:15 AM Staci AcostaMiller, [place], PT [facility] None  10/12/2018 11:15 AM Staci AcostaMiller, [place], PT [facility] None   Primary Care Physician: Yates DecampBlackwell, Samuel Dwight, MD [facility] Note by: Oswaldo DoneFrancisco A Aidenn Skellenger, MD Date: 08/08/2018; Time: 10:45 AM  Disclaimer:  Medicine is not an Visual merchandiserexact science. The only guarantee in medicine is that nothing is guaranteed. It is important to note that the decision to proceed with this intervention was based on the information collected from the patient. The Data and conclusions were drawn from the patient's questionnaire, the interview, and the physical examination. Because the information was provided in large part by the patient, it cannot be guaranteed that it has not been purposely or unconsciously manipulated. Every effort has been made to obtain as much relevant data as possible for this evaluation. It is important to note that the conclusions that lead to this procedure are derived in large part from the available data. Always take into account that the treatment will also be dependent on availability of resources and existing treatment guidelines, considered by other Pain Management Practitioners as being common knowledge and practice, at the time of the intervention. For Medico-Legal purposes, it is also  important to point out that variation in procedural techniques and pharmacological choices are the acceptable norm. The indications, contraindications, technique, and results of the above procedure should only be interpreted and judged by a Board-Certified Interventional Pain Specialist with extensive familiarity and expertise in the same exact procedure and technique.

## 2018-08-09 ENCOUNTER — Telehealth: Payer: Self-pay

## 2018-08-09 NOTE — Telephone Encounter (Signed)
Post procedure phone call.  Patient states she is doing well.  

## 2018-08-22 ENCOUNTER — Encounter: Payer: Self-pay | Admitting: Physical Therapy

## 2018-08-22 ENCOUNTER — Ambulatory Visit: Payer: Managed Care, Other (non HMO) | Attending: Pain Medicine | Admitting: Physical Therapy

## 2018-08-22 DIAGNOSIS — M25552 Pain in left hip: Secondary | ICD-10-CM

## 2018-08-22 NOTE — Progress Notes (Signed)
Patient's Name: Amber Reed  MRN: [id number]  Referring Provider: Girard Cooter*  DOB: 06-03-1981  PCP: Elisabeth Most, MD  DOS: 08/23/2018  Note by: Gaspar Cola, MD  Service setting: Ambulatory outpatient  Specialty: Interventional Pain Management  Location: [facility] (AMB) Pain Management Facility    Patient type: Established   Primary Reason(s) for Visit: Encounter for post-procedure evaluation of chronic illness with mild to moderate exacerbation CC: Hip Pain  HPI  Amber Reed is a 38 y.o. year old, female patient, who comes today for a post-procedure evaluation. She has Endometriosis; Allergic rhinitis due to animal hair and dander; DDD (degenerative disc disease), cervical; Dysfunction of right eustachian tube; Family history of factor V Leiden mutation; GAD (generalized anxiety disorder); Hearing loss, sensorineural, asymmetrical; Insomnia; Lyme disease; Major depression; Meniere's disease of right ear; Migraines; Snoring; Spondylolisthesis at L5-S1 level; Spondylosis; Vaginal infection; Vestibular dizziness; Chronic groin pain (Primary Area of Pain) (Left); Chronic hip pain (Secondary Area of Pain) (Left); Chronic low back pain (Fourth Area of Pain) (Bilateral) (L>R) w/ sciatica (Bilateral); Chronic lower extremity pain (Bilateral) (L>R); Chronic pain syndrome; Pharmacologic therapy; Disorder of skeletal system; Problems influencing health status; Abnormal MRI, lumbar spine (12/23/2017); Lumbar facet arthropathy; Lumbar facet syndrome; Grade 1 Anterolisthesis L5/S1; Lumbar lateral recess stenosis; Lumbar foraminal stenosis; Lumbar nerve root impingement (L5) (Left); Lumbar pars defect (L5) (Bilateral); Lumbar L5 pars defect w/ spondylolisthesis; Chronic musculoskeletal pain; Chronic sacroiliac joint pain (Tertiary Area of Pain) (Right); Osteoarthritis of hip (Left); Chronic lumbar radiculitis (S1) (Left); Neurogenic pain; and DDD (degenerative disc disease), lumbosacral on  their problem list. Her primarily concern today is the Hip Pain  Pain Assessment: Location: Left, Posterior, Anterior Hip Radiating: tingling in toes, pain radiaties down the side of thigh ,down the back of leg to foot Onset: More than a month ago Duration: Chronic pain Quality: Aching, Tingling, Nagging Severity: 3 /10 (subjective, self-reported pain score)  Note: Reported level is compatible with observation.                         When using our objective Pain Scale, levels between 6 and 10/10 are said to belong in an emergency room, as it progressively worsens from a 6/10, described as severely limiting, requiring emergency care not usually available at an outpatient pain management facility. At a 6/10 level, communication becomes difficult and requires great effort. Assistance to reach the emergency department may be required. Facial flushing and profuse sweating along with potentially dangerous increases in heart rate and blood pressure will be evident. Effect on ADL: no prolonged sitting Timing: Constant Modifying factors: stretching BP: 118/71  HR: 91  Amber Reed comes in today for post-procedure evaluation.  Further details on both, my assessment(s), as well as the proposed treatment plan, please see below.  Post-Procedure Assessment  08/08/2018 Procedure: Diagnostic left intra-articular hip joint injection #1 under fluoroscopic guidance, no sedation. Pre-procedure pain score:  3/10 Post-procedure pain score: 3/10 No initial benefit, possibly due to rapid discharge after no sedation procedure, without enough time to allow full onset of block. Influential Factors: BMI: 27.20 kg/m Intra-procedural challenges: None observed.         Assessment challenges: None detected.              Reported side-effects: None.        Post-procedural adverse reactions or complications: None reported         Sedation: No sedation used. When no sedatives are used,  the analgesic levels obtained are  directly associated to the effectiveness of the local anesthetics. However, when sedation is provided, the level of analgesia obtained during the initial 1 hour following the intervention, is believed to be the result of a combination of factors. These factors may include, but are not limited to: 1. The effectiveness of the local anesthetics used. 2. The effects of the analgesic(s) and/or anxiolytic(s) used. 3. The degree of discomfort experienced by the patient at the time of the procedure. 4. The patients ability and reliability in recalling and recording the events. 5. The presence and influence of possible secondary gains and/or psychosocial factors. Reported result: Relief experienced during the 1st hour after the procedure: 100 % (Ultra-Short Term Relief)            Interpretative annotation: Clinically appropriate result. No IV Analgesic or Anxiolytic given, therefore benefits are completely due to Local Anesthetic effects.          Effects of local anesthetic: The analgesic effects attained during this period are directly associated to the localized infiltration of local anesthetics and therefore cary significant diagnostic value as to the etiological location, or anatomical origin, of the pain. Expected duration of relief is directly dependent on the pharmacodynamics of the local anesthetic used. Long-acting (4-6 hours) anesthetics used.  Reported result: Relief during the next 4 to 6 hour after the procedure: 100 % (Short-Term Relief)            Interpretative annotation: Clinically appropriate result. Analgesia during this period is likely to be Local Anesthetic-related.          Long-term benefit: Defined as the period of time past the expected duration of local anesthetics (1 hour for short-acting and 4-6 hours for long-acting). With the possible exception of prolonged sympathetic blockade from the local anesthetics, benefits during this period are typically attributed to, or associated  with, other factors such as analgesic sensory neuropraxia, antiinflammatory effects, or beneficial biochemical changes provided by agents other than the local anesthetics.  Reported result: Extended relief following procedure: 100 %(lasted about week) (Long-Term Relief)            Interpretative annotation: Clinically possible results. Good relief. No permanent benefit expected. Inflammation plays a part in the etiology to the pain.          Current benefits: Defined as reported results that persistent at this point in time.   Analgesia: 25 %            Function: Somewhat improved ROM: Somewhat improved Interpretative annotation: Recurrence of symptoms. No permanent benefit expected. Effective diagnostic intervention.          Interpretation: Results would suggest a successful diagnostic intervention.                  Plan:  Please see "Plan of Care" for details.                Laboratory Chemistry  Inflammation Markers (CRP: Acute Phase) (ESR: Chronic Phase) Lab Results  Component Value Date   CRP 7 06/29/2018   ESRSEDRATE 15 06/29/2018                         Renal Markers Lab Results  Component Value Date   BUN 13 06/29/2018   CREATININE 0.75 06/29/2018   BCR 17 06/29/2018   GFRAA 118 06/29/2018   GFRNONAA 102 06/29/2018  Hepatic Markers Lab Results  Component Value Date   AST 15 06/29/2018   ALBUMIN 4.8 06/29/2018                        Neuropathy Markers Lab Results  Component Value Date   VITAMINB12 1,548 (H) 06/29/2018                        Note: Lab results reviewed.  Recent Imaging Results   Results for orders placed in visit on 08/08/18  DG C-Arm 1-60 Min-No Report   Narrative Fluoroscopy was utilized by the requesting physician.  No radiographic  interpretation.    Interpretation Report: Fluoroscopy was used during the procedure to assist with needle guidance. The images were interpreted intraoperatively by the requesting  physician.  Meds   Current Outpatient Medications:  .  Betahistine HCl (BETAHISTINE DIHYDROCHLORIDE) POWD, Use 24 mg once daily, Disp: , Rfl:  .  Cyanocobalamin-Methylcobalamin 600-600 MCG SUBL, Place under the tongue., Disp: , Rfl:  .  diclofenac (CATAFLAM) 50 MG tablet, diclofenac potassium 50 mg tablet  TAKE 1 TABLET BY MOUTH EVERY 6 HOURS as needed, Disp: , Rfl:  .  hydrochlorothiazide (HYDRODIURIL) 12.5 MG tablet, Take 12.5 mg by mouth daily., Disp: , Rfl:  .  methocarbamol (ROBAXIN) 500 MG tablet, Take 1 tablet (500 mg total) by mouth at bedtime., Disp: 30 tablet, Rfl: 2 .  ondansetron (ZOFRAN-ODT) 8 MG disintegrating tablet, , Disp: , Rfl:  .  vitamin E 600 UNIT capsule, Take by mouth., Disp: , Rfl:  .  azelastine (ASTELIN) 0.1 % nasal spray, Place into the nose., Disp: , Rfl:  .  gabapentin (NEURONTIN) 100 MG capsule, Take 1-3 capsules (100-300 mg total) by mouth at bedtime for 30 days. Follow written titration schedule., Disp: 90 capsule, Rfl: 0  ROS  Constitutional: Denies any fever or chills Gastrointestinal: No reported hemesis, hematochezia, vomiting, or acute GI distress Musculoskeletal: Denies any acute onset joint swelling, redness, loss of ROM, or weakness Neurological: No reported episodes of acute onset apraxia, aphasia, dysarthria, agnosia, amnesia, paralysis, loss of coordination, or loss of consciousness  Allergies  Amber Reed is allergic to other and amoxicillin.  [place]  Drug: Amber Reed  reports previous drug use. Alcohol:  has no history on file for alcohol. Tobacco:  reports that she has never smoked. She has never used smokeless tobacco. Medical:  has a past medical history of Anxiety, Endometriosis, Factor 5 Leiden mutation, heterozygous ([place]), Meniere disease, Migraine, and PCO (polycystic ovaries). Surgical: Amber Reed  has a past surgical history that includes Cesarean section; Nasal sinus surgery; Pelvic laparoscopy; and Small intestine surgery. Family:  family history includes Cancer in her maternal grandmother; Diabetes in her mother; Seizures in her paternal aunt; Stroke in her paternal grandmother.  Constitutional Exam  General appearance: Well nourished, well developed, and well hydrated. In no apparent acute distress Vitals:   08/23/18 1036  BP: 118/71  Pulse: 91  Temp: 98.5 F (36.9 C)  SpO2: 99%  Weight: 195 lb (88.5 kg)  Height: '5\' 11"'  (1.803 m)   BMI Assessment: Estimated body mass index is 27.2 kg/m as calculated from the following:   Height as of this encounter: '5\' 11"'  (1.803 m).   Weight as of this encounter: 195 lb (88.5 kg).  BMI interpretation table: BMI level Category Range association with higher incidence of chronic pain  <18 kg/m2 Underweight   18.5-24.9 kg/m2 Ideal body weight  25-29.9 kg/m2 Overweight Increased incidence by 20%  30-34.9 kg/m2 Obese (Class I) Increased incidence by 68%  35-39.9 kg/m2 Severe obesity (Class II) Increased incidence by 136%  >40 kg/m2 Extreme obesity (Class III) Increased incidence by 254%   Patient's current BMI Ideal Body weight  Body mass index is 27.2 kg/m. Ideal body weight: 70.8 kg (156 lb 1.4 oz) Adjusted ideal body weight: 77.9 kg (171 lb 10.4 oz)   BMI Readings from Last 4 Encounters:  08/23/18 27.20 kg/m  08/08/18 27.20 kg/m  07/24/18 27.20 kg/m  06/29/18 27.98 kg/m   Wt Readings from Last 4 Encounters:  08/23/18 195 lb (88.5 kg)  08/08/18 195 lb (88.5 kg)  07/24/18 195 lb (88.5 kg)  06/29/18 195 lb (88.5 kg)  Psych/Mental status: Alert, oriented x 3 (person, place, & time)       Eyes: PERLA Respiratory: No evidence of acute respiratory distress  Cervical Spine Area Exam  Skin & Axial Inspection: No masses, redness, edema, swelling, or associated skin lesions Alignment: Symmetrical Functional ROM: Unrestricted ROM      Stability: No instability detected Muscle Tone/Strength: Functionally intact. No obvious neuro-muscular anomalies detected. Sensory  (Neurological): Unimpaired Palpation: No palpable anomalies              Upper Extremity (UE) Exam    Side: Right upper extremity  Side: Left upper extremity  Skin & Extremity Inspection: Skin color, temperature, and hair growth are WNL. No peripheral edema or cyanosis. No masses, redness, swelling, asymmetry, or associated skin lesions. No contractures.  Skin & Extremity Inspection: Skin color, temperature, and hair growth are WNL. No peripheral edema or cyanosis. No masses, redness, swelling, asymmetry, or associated skin lesions. No contractures.  Functional ROM: Unrestricted ROM          Functional ROM: Unrestricted ROM          Muscle Tone/Strength: Functionally intact. No obvious neuro-muscular anomalies detected.  Muscle Tone/Strength: Functionally intact. No obvious neuro-muscular anomalies detected.  Sensory (Neurological): Unimpaired          Sensory (Neurological): Unimpaired          Palpation: No palpable anomalies              Palpation: No palpable anomalies              Provocative Test(s):  Phalen's test: deferred Tinel's test: deferred Apley's scratch test (touch opposite shoulder):  Action 1 (Across chest): deferred Action 2 (Overhead): deferred Action 3 (LB reach): deferred   Provocative Test(s):  Phalen's test: deferred Tinel's test: deferred Apley's scratch test (touch opposite shoulder):  Action 1 (Across chest): deferred Action 2 (Overhead): deferred Action 3 (LB reach): deferred    Thoracic Spine Area Exam  Skin & Axial Inspection: No masses, redness, or swelling Alignment: Symmetrical Functional ROM: Unrestricted ROM Stability: No instability detected Muscle Tone/Strength: Functionally intact. No obvious neuro-muscular anomalies detected. Sensory (Neurological): Unimpaired Muscle strength & Tone: No palpable anomalies  Lumbar Spine Area Exam  Skin & Axial Inspection: No masses, redness, or swelling Alignment: Symmetrical Functional ROM: Unrestricted ROM        Stability: No instability detected Muscle Tone/Strength: Functionally intact. No obvious neuro-muscular anomalies detected. Sensory (Neurological): Unimpaired Palpation: No palpable anomalies       Provocative Tests: Hyperextension/rotation test: deferred today       Lumbar quadrant test (Kemp's test): deferred today       Lateral bending test: deferred today       Patrick's Maneuver: deferred today  FABER* test: deferred today                   S-I anterior distraction/compression test: deferred today         S-I lateral compression test: deferred today         S-I Thigh-thrust test: deferred today         S-I Gaenslen's test: deferred today         *(Flexion, ABduction and External Rotation)  Gait & Posture Assessment  Ambulation: Unassisted Gait: Relatively normal for age and body habitus Posture: WNL   Lower Extremity Exam    Side: Right lower extremity  Side: Left lower extremity  Stability: No instability observed          Stability: No instability observed          Skin & Extremity Inspection: Skin color, temperature, and hair growth are WNL. No peripheral edema or cyanosis. No masses, redness, swelling, asymmetry, or associated skin lesions. No contractures.  Skin & Extremity Inspection: Skin color, temperature, and hair growth are WNL. No peripheral edema or cyanosis. No masses, redness, swelling, asymmetry, or associated skin lesions. No contractures.  Functional ROM: Unrestricted ROM                  Functional ROM: Unrestricted ROM                  Muscle Tone/Strength: Able to Toe-walk & Heel-walk without problems  Muscle Tone/Strength: Able to Toe-walk & Heel-walk without problems  Sensory (Neurological): Dermatomal pain pattern (L3/4)  Sensory (Neurological): Dermatomal pain pattern bottom of foot (S1)  DTR: Patellar: deferred today Achilles: deferred today Plantar: deferred today  DTR: Patellar: deferred today Achilles: deferred  today Plantar: deferred today  Palpation: No palpable anomalies  Palpation: No palpable anomalies   Assessment   Status Diagnosis  Persistent Improved Unimproved 1. Chronic lower extremity pain (Bilateral) (L>R)   2. Chronic groin pain (Primary Area of Pain) (Left)   3. Chronic hip pain (Secondary Area of Pain) (Left)   4. DDD (degenerative disc disease), lumbosacral   5. Chronic lumbar radiculitis (S1) (Left)   6. Lumbar nerve root impingement (L5) (Left)   7. Lumbar pars defect (L5) (Bilateral)   8. Lumbar L5 pars defect w/ spondylolisthesis   9. Grade 1 Anterolisthesis L5/S1   10. Spondylolisthesis at L5-S1 level   11. Chronic low back pain (Fourth Area of Pain) (Bilateral) (L>R) w/ sciatica (Bilateral)   12. Abnormal MRI, lumbar spine (12/23/2017)   13. Chronic musculoskeletal pain   14. Neurogenic pain      Updated Problems: Problem  Chronic lumbar radiculitis (S1) (Left)  Neurogenic Pain  Ddd (Degenerative Disc Disease), Lumbosacral  Lumbar facet syndrome    Plan of Care  Pharmacotherapy (Medications Ordered): Meds ordered this encounter  Medications  . methocarbamol (ROBAXIN) 500 MG tablet    Sig: Take 1 tablet (500 mg total) by mouth at bedtime.    Dispense:  30 tablet    Refill:  2    Do not place this medication, or any other prescription from our practice, on "Automatic Refill". Patient may have prescription filled one day early if pharmacy is closed on scheduled refill date.  . gabapentin (NEURONTIN) 100 MG capsule    Sig: Take 1-3 capsules (100-300 mg total) by mouth at bedtime for 30 days. Follow written titration schedule.    Dispense:  90 capsule    Refill:  0    Do not  place medication on "Automatic Refill". Fill one day early if pharmacy is closed on scheduled refill date.   Medications administered today: Amber Reed had no medications administered during this visit.   Procedure Orders     Lumbar Transforaminal Epidural     Lumbar Epidural  Injection Lab Orders  No laboratory test(s) ordered today    Imaging Orders     DG Lumbar Spine Complete W/Bend Referral Orders  No referral(s) requested today   Interventional management options: Planned, scheduled, and/or pending:   Diagnostic left-sided L5 + S1 transforaminal ESI #1+ diagnostic right-sided L4-5 LESI #1 under fluoroscopic guidance and IV sedation   Considering:   Diagnostic left L4-5 interlaminar LESI  Diagnostic left L4 transforaminal ESI  Diagnostic left L5-S1 LESI  Diagnostic bilateral vs left L5 transforaminal ESI  Diagnostic bilateral lumbar facet blocks  Possible bilateral lumbar facet RFA  Diagnosticleft lateral femoral cutaneous nerve block  Diagnostic left sacroiliac joint injection  Possible left sacroiliac RFA     Palliative PRN treatment(s):   Diagnostic left intra-articular hip joint injection #2  (some benefit but not significant)   Provider-requested follow-up: Return for Procedure (w/ sedation): (L) L5 & S1 TFESI#1 + (R) L4-5 LESI #1.  Future Appointments  Date Time Provider [place]  08/25/2018 10:45 AM Shelton Silvas, PT [facility] None  08/29/2018 10:30 AM Shelton Silvas, PT [facility] None  08/31/2018 10:30 AM Shelton Silvas, PT [facility] None  09/05/2018 10:30 AM Shelton Silvas, PT [facility] None  09/07/2018 10:30 AM Shelton Silvas, PT [facility] None  09/12/2018 10:30 AM Shelton Silvas, PT [facility] None  09/14/2018 10:30 AM Shelton Silvas, PT [facility] None  09/19/2018 10:30 AM Shelton Silvas, PT [facility] None  09/21/2018 11:15 AM Shelton Silvas, PT [facility] None  09/26/2018 11:15 AM Shelton Silvas, PT [facility] None  09/28/2018 11:15 AM Shelton Silvas, PT [facility] None  10/03/2018 11:15 AM Shelton Silvas, PT [facility] None  10/05/2018 11:15 AM Shelton Silvas, PT [facility] None  10/10/2018 11:15 AM Shelton Silvas, PT [facility] None  10/12/2018 11:15 AM Shelton Silvas, PT [facility] None   Primary Care Physician: Elisabeth Most, MD Location: [facility] Note by: Gaspar Cola, MD Date: 08/23/2018; Time: 5:01 PM

## 2018-08-22 NOTE — Therapy (Signed)
[facility]ICINE [address] [place], [address] Phone: [phone]   Fax:  [phone]  Physical Therapy Evaluation  Patient Details  Name: Amber Reed MRN: [id number] Date of Birth: Apr 04, 1981 Referring Provider (PT): Laban Emperor    Encounter Date: 08/22/2018  PT End of Session - 08/22/18 1105    Visit Number  1    Number of Visits  17    Date for PT Re-Evaluation  10/17/18    PT Start Time  1045    PT Stop Time  1145    PT Time Calculation (min)  60 min    Activity Tolerance  Patient tolerated treatment well    Behavior During Therapy  [facility] for tasks assessed/performed       Past Medical History:  Diagnosis Date  . Anxiety   . Endometriosis   . Factor 5 Leiden mutation, heterozygous (HCC)   . Meniere disease   . Migraine   . PCO (polycystic ovaries)     Past Surgical History:  Procedure Laterality Date  . CESAREAN SECTION    . NASAL SINUS SURGERY    . PELVIC LAPAROSCOPY    . SMALL INTESTINE SURGERY      There were no vitals filed for this visit.       OBJECTIVE  Mental Status Patient is oriented to person, place and time.  Recent memory is intact.  Remote memory is intact.  Attention span and concentration are intact.  Expressive speech is intact.  Patient's fund of knowledge is within normal limits for educational level.  SENSATION: Grossly intact to light touch bilateral L as determined by testing dermatomes L2-S2 Proprioception and hot/cold testing deferred on this date   MUSCULOSKELETAL: Tremor: None Bulk: Normal Tone: Normal   Posture No abnormalities  Gait  No abnormalities  Palpation TTP directly over L greater trochanter with concordant sign with sidelying. TTP with noted trigger points along glute med. TTP with grimace response localized to pectineus tendon with tenderness with decreased pain along muscle belly   Strength (out of 5) R/L 5/5 Hip flexion 5/5  Hip ER  5/5 Hip IR 5/5 Hip abduction 5/5 Hip adduction 5/5 Hip extension 5/5 Knee extension 5/5 Knee flexion 5/5 Ankle dorsiflexion *Indicates pain   AROM (degrees) All hip and lumbar movements including overpressure wnl without pain  PROM (degrees) PROM = AROM  Repeated Movements No centralization or peripheralization of symptoms with repeated lumbar extension or flexion.    Passive Accessory Intervertebral Motion (PAIVM) Pt denies reproduction of posterior hip pain with CPA L1-L5 and UPA bilaterally L1-L5. Generally hypomobile throughout  Passive Physiological Intervertebral Motion (PPIVM) Normal flexion and extension with PPIVM testing   SPECIAL TESTS Slump: Negative bilat SLR: Negative Crossed ONG:EXBMWUXL FABER: R: Negative L: Positive FADIR: negative bilat Hip scour: negative bilat Ely: Negative bilat Thomas: Positive bilat Ober: R: Negative L: Positive  90/90 Positive bilat asymptomatic   Ther-Ex - Ober stretch 30sec hold - Thomas stretch  30sec hold - Half kneeling adductor stretch with upper trunk rotation 30sec hold - Frog stretch  30sec hold Education on muscle tension; utilizing ice at hip bursitis; and anatomy/pathogology involved           [facility] PT Assessment - 08/22/18 0001      Assessment   Medical Diagnosis  Left hip pain    Referring Provider (PT)  Laban Emperor     Onset Date/Surgical Date  06/22/19    Hand Dominance  Right  Next MD Visit  yes tomorrow    Prior Therapy  yes      Balance Screen   Has the patient fallen in the past 6 months  No    Has the patient had a decrease in activity level because of a fear of falling?   No    Is the patient reluctant to leave their home because of a fear of falling?   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              PT Education - 08/22/18 1104    Education Details  Patient was educated on diagnosis, anatomy and pathology involved, prognosis, role of PT, and was given an HEP, demonstrating exercise with proper form following verbal and tactile cues, and was given a paper hand out to continue exercise at home. Pt was educated on and agreed to plan of care.    Person(s) Educated  Patient    Methods  Explanation;Demonstration;Tactile cues;Verbal cues;Handout    Comprehension  Verbalized understanding;Verbal cues required;Returned demonstration;Tactile cues required       PT Short Term Goals - 08/22/18 1214      PT SHORT TERM GOAL #1   Title  Pt will be independent with HEP in order to improve strength and balance in order to decrease fall risk and improve function at home and work.    Time  8    Period  Weeks    Status  New        PT Long Term Goals - 08/22/18 1215      PT LONG TERM GOAL #1   Title  Pt will decrease worst pain as reported on NPRS by at least 3 points in order to demonstrate clinically significant reduction in pain.    Baseline  08/22/18 4/10    Time  8    Period  Weeks    Status  New      PT LONG TERM GOAL #2   Title  Patient will increase FOTO score to 69 to demonstrate predicted increase in functional mobility to complete ADLs    Baseline  08/22/18 67    Time  8    Period  Weeks    Status  New      PT LONG TERM GOAL #3   Title  Patient will report no pain with sitting in order to complete work duties with precision    Baseline  08/22/18 Sit for 5-10 mins with 4/10  pain and needing to stretch/stand    Time  8    Period  Weeks    Status  New  Plan - 08/22/18 1219    Clinical Impression Statement  Pt is a pleasant 38 year-old female referred for left hip pain. PT examination reveals deficits in hip pain, increased muscle tension/spasm, and decreased flexibility. 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 Pt will benefit from skilled PT services to address deficits and return to pain-free function at home and work.     Clinical Presentation  Evolving    Clinical Presentation due to:  Moderate (evolving): 1-2 personal factors/comorbidities, 3 or more body systems/activity limitations/participation restrictions      Clinical Decision Making  Moderate    Rehab Potential  Good    Clinical Impairments Affecting Rehab Potential  (+) age, lack of other comorbidities (-) sedentary lifestyle, chronicity of pain, multiple pain sites    PT Frequency  2x / week    PT Duration  8 weeks    PT Treatment/Interventions  Aquatic Therapy;Electrical Stimulation;Cryotherapy;Moist Heat;Iontophoresis 4mg /ml Dexamethasone;Ultrasound;Traction;Gait training;Stair training;Therapeutic activities;Therapeutic exercise;Neuromuscular re-education;Functional mobility training;Balance training;Patient/family education;Manual techniques;Dry needling;Passive range of motion;Taping;Spinal Manipulations;Joint Manipulations    PT Next Visit Plan  HEP review, TDN?    PT Home Exercise Plan  adductor, glute med stretching    Consulted and Agree with Plan of Care  Patient       Patient will benefit from skilled therapeutic intervention in order to improve the following deficits and impairments:  Decreased activity tolerance, Decreased endurance, Decreased range of motion, Increased fascial restricitons, Improper body mechanics, Pain, Decreased mobility, Postural dysfunction, Impaired  tone, Impaired flexibility, Increased muscle spasms  Visit Diagnosis: Pain in left hip     Problem List Patient Active Problem List   Diagnosis Date Noted  . Osteoarthritis of hip (Left) 08/08/2018  . Abnormal MRI, lumbar spine (12/23/2017) 07/24/2018  . Lumbar facet arthropathy 07/24/2018  . Lumbar facet joint syndrome 07/24/2018  . Grade 1 Anterolisthesis L5/S1 07/24/2018  . Lumbar lateral recess stenosis 07/24/2018  . Lumbar foraminal stenosis 07/24/2018  . Lumbar nerve root impingement (L5) (Left) 07/24/2018  . Lumbar pars defect (L5) (Bilateral) 07/24/2018  . Lumbar L5 pars defect w/ spondylolisthesis 07/24/2018  . Chronic musculoskeletal pain 07/24/2018  . Chronic sacroiliac joint pain [facility](Tertiary Area of Pain) (Right) 07/24/2018  . Family history of factor V Leiden mutation 06/29/2018  . GAD (generalized anxiety disorder) 06/29/2018  . Lyme disease 06/29/2018  . Major depression 06/29/2018  . Spondylosis 06/29/2018  . Vaginal infection 06/29/2018  . Chronic groin pain (Primary Area of Pain) (Left) 06/29/2018  . Chronic hip pain (Secondary Area of Pain) (Left) 06/29/2018  . Chronic low back pain (Fourth Area of Pain) (Bilateral) (L>R) w/ sciatica (Bilateral) 06/29/2018  . Chronic lower extremity pain (Bilateral) (L>R) 06/29/2018  . Chronic pain syndrome 06/29/2018  . Pharmacologic therapy 06/29/2018  . Disorder of skeletal system 06/29/2018  . Problems influencing health status 06/29/2018  . Endometriosis 01/10/2018  . Meniere's disease of right ear 06/14/2017  . Allergic rhinitis due to animal hair and dander 02/15/2017  . Dysfunction of right eustachian tube 02/15/2017  . Hearing loss, sensorineural, asymmetrical 02/15/2017  . Vestibular dizziness 02/15/2017  . Insomnia 06/17/2014  . Snoring 06/17/2014  . DDD (degenerative disc disease), cervical 05/21/2014  . Migraines 05/21/2014  . Spondylolisthesis at L5-S1 level 05/21/2014   Staci Acostahelsea Miller PT, DPT Staci Acostahelsea  Miller 08/22/2018, 12:44 PM  [place] [facility] [address] [place], [place], [address] Phone:  [phone]   Fax:  [phone]  Name: Jeral PinchSamantha D Loge MRN: [id number] Date of Birth: May 13, 1981

## 2018-08-23 ENCOUNTER — Ambulatory Visit
Admission: RE | Admit: 2018-08-23 | Discharge: 2018-08-23 | Disposition: A | Discharge status: Home / Self Care | Payer: Managed Care, Other (non HMO) | Source: Ambulatory Visit | Attending: Pain Medicine | Admitting: Pain Medicine

## 2018-08-23 ENCOUNTER — Encounter: Payer: Self-pay | Admitting: Pain Medicine

## 2018-08-23 ENCOUNTER — Ambulatory Visit
Admission: RE | Admit: 2018-08-23 | Discharge: 2018-08-23 | Disposition: A | Discharge status: Home / Self Care | Payer: Managed Care, Other (non HMO) | Attending: Pain Medicine | Admitting: Pain Medicine

## 2018-08-23 ENCOUNTER — Ambulatory Visit (HOSPITAL_BASED_OUTPATIENT_CLINIC_OR_DEPARTMENT_OTHER): Payer: Managed Care, Other (non HMO) | Admitting: Pain Medicine

## 2018-08-23 ENCOUNTER — Other Ambulatory Visit: Payer: Self-pay

## 2018-08-23 VITALS — BP 118/71 | HR 91 | Temp 98.5°F | Ht 71.0 in | Wt 195.0 lb

## 2018-08-23 DIAGNOSIS — G8929 Other chronic pain: Secondary | ICD-10-CM | POA: Insufficient documentation

## 2018-08-23 DIAGNOSIS — M4306 Spondylolysis, lumbar region: Secondary | ICD-10-CM | POA: Insufficient documentation

## 2018-08-23 DIAGNOSIS — M4317 Spondylolisthesis, lumbosacral region: Secondary | ICD-10-CM

## 2018-08-23 DIAGNOSIS — M5137 Other intervertebral disc degeneration, lumbosacral region: Secondary | ICD-10-CM | POA: Diagnosis not present

## 2018-08-23 DIAGNOSIS — M5441 Lumbago with sciatica, right side: Secondary | ICD-10-CM

## 2018-08-23 DIAGNOSIS — R937 Abnormal findings on diagnostic imaging of other parts of musculoskeletal system: Secondary | ICD-10-CM

## 2018-08-23 DIAGNOSIS — M4310 Spondylolisthesis, site unspecified: Secondary | ICD-10-CM

## 2018-08-23 DIAGNOSIS — M792 Neuralgia and neuritis, unspecified: Secondary | ICD-10-CM | POA: Insufficient documentation

## 2018-08-23 DIAGNOSIS — M5442 Lumbago with sciatica, left side: Secondary | ICD-10-CM

## 2018-08-23 DIAGNOSIS — M7918 Myalgia, other site: Secondary | ICD-10-CM | POA: Insufficient documentation

## 2018-08-23 DIAGNOSIS — R1032 Left lower quadrant pain: Secondary | ICD-10-CM | POA: Diagnosis not present

## 2018-08-23 DIAGNOSIS — M79604 Pain in right leg: Secondary | ICD-10-CM | POA: Insufficient documentation

## 2018-08-23 DIAGNOSIS — M25552 Pain in left hip: Secondary | ICD-10-CM | POA: Insufficient documentation

## 2018-08-23 DIAGNOSIS — M5416 Radiculopathy, lumbar region: Secondary | ICD-10-CM | POA: Insufficient documentation

## 2018-08-23 DIAGNOSIS — M4300 Spondylolysis, site unspecified: Secondary | ICD-10-CM | POA: Insufficient documentation

## 2018-08-23 DIAGNOSIS — M79605 Pain in left leg: Secondary | ICD-10-CM | POA: Insufficient documentation

## 2018-08-23 DIAGNOSIS — M51379 Other intervertebral disc degeneration, lumbosacral region without mention of lumbar back pain or lower extremity pain: Secondary | ICD-10-CM | POA: Insufficient documentation

## 2018-08-23 MED ORDER — METHOCARBAMOL 500 MG PO TABS: 500.0000 mg | ORAL_TABLET | Freq: Every day | ORAL | 2 refills | Status: DC

## 2018-08-23 MED ORDER — GABAPENTIN 100 MG PO CAPS: 100.0000 mg | ORAL_CAPSULE | Freq: Every day | ORAL | 0 refills | Status: DC

## 2018-08-23 NOTE — Patient Instructions (Signed)
____________________________________________________________________________________________  Preparing for Procedure with Sedation  Instructions: . Oral Intake: Do not eat or drink anything for at least 8 hours prior to your procedure. . Transportation: Public transportation is not allowed. Bring an adult driver. The driver must be physically present in our waiting room before any procedure can be started. . Physical Assistance: Bring an adult physically capable of assisting you, in the event you need help. This adult should keep you company at home for at least 6 hours after the procedure. . Blood Pressure Medicine: Take your blood pressure medicine with a sip of water the morning of the procedure. . Blood thinners: Notify our staff if you are taking any blood thinners. Depending on which one you take, there will be specific instructions on how and when to stop it. . Diabetics on insulin: Notify the staff so that you can be scheduled 1st case in the morning. If your diabetes requires high dose insulin, take only  of your normal insulin dose the morning of the procedure and notify the staff that you have done so. . Preventing infections: Shower with an antibacterial soap the morning of your procedure. . Build-up your immune system: Take 1000 mg of Vitamin C with every meal (3 times a day) the day prior to your procedure. . Antibiotics: Inform the staff if you have a condition or reason that requires you to take antibiotics before dental procedures. . Pregnancy: If you are pregnant, call and cancel the procedure. . Sickness: If you have a cold, fever, or any active infections, call and cancel the procedure. . Arrival: You must be in the facility at least 30 minutes prior to your scheduled procedure. . Children: Do not bring children with you. . Dress appropriately: Bring dark clothing that you would not mind if they get stained. . Valuables: Do not bring any jewelry or valuables.  Procedure  appointments are reserved for interventional treatments only. . No Prescription Refills. . No medication changes will be discussed during procedure appointments. . No disability issues will be discussed.  Reasons to call and reschedule or cancel your procedure: (Following these recommendations will minimize the risk of a serious complication.) . Surgeries: Avoid having procedures within 2 weeks of any surgery. (Avoid for 2 weeks before or after any surgery). . Flu Shots: Avoid having procedures within 2 weeks of a flu shots or . (Avoid for 2 weeks before or after immunizations). . Barium: Avoid having a procedure within 7-10 days after having had a radiological study involving the use of radiological contrast. (Myelograms, Barium swallow or enema study). . Heart attacks: Avoid any elective procedures or surgeries for the initial 6 months after a "Myocardial Infarction" (Heart Attack). . Blood thinners: It is imperative that you stop these medications before procedures. Let us know if you if you take any blood thinner.  . Infection: Avoid procedures during or within two weeks of an infection (including chest colds or gastrointestinal problems). Symptoms associated with infections include: Localized redness, fever, chills, night sweats or profuse sweating, burning sensation when voiding, cough, congestion, stuffiness, runny nose, sore throat, diarrhea, nausea, vomiting, cold or Flu symptoms, recent or current infections. It is specially important if the infection is over the area that we intend to treat. . Heart and lung problems: Symptoms that may suggest an active cardiopulmonary problem include: cough, chest pain, breathing difficulties or shortness of breath, dizziness, ankle swelling, uncontrolled high or unusually low blood pressure, and/or palpitations. If you are experiencing any of these symptoms, cancel   your procedure and contact your primary care physician for an evaluation.  Remember:   Regular Business hours are:  Monday to Thursday 8:00 AM to 4:00 PM  Provider's Schedule: Jourdain Guay, MD:  Procedure days: Tuesday and Thursday 7:30 AM to 4:00 PM  Bilal Lateef, MD:  Procedure days: Monday and Wednesday 7:30 AM to 4:00 PM ____________________________________________________________________________________________    

## 2018-08-24 ENCOUNTER — Ambulatory Visit: Payer: Managed Care, Other (non HMO) | Admitting: Physical Therapy

## 2018-08-25 ENCOUNTER — Ambulatory Visit: Payer: Managed Care, Other (non HMO) | Admitting: Physical Therapy

## 2018-08-29 ENCOUNTER — Ambulatory Visit: Payer: Managed Care, Other (non HMO) | Admitting: Physical Therapy

## 2018-08-29 ENCOUNTER — Encounter: Payer: Self-pay | Admitting: Physical Therapy

## 2018-08-29 DIAGNOSIS — M25552 Pain in left hip: Secondary | ICD-10-CM

## 2018-08-29 NOTE — Therapy (Signed)
[facility] [address] [place], [place], [address] Phone: [phone]   Fax:  [phone]  Physical Therapy Treatment  Patient Details  Name: Amber Reed MRN: [id number] Date of Birth: 01/03/1981 Referring Provider (PT): Laban EmperorNaveira    Encounter Date: 08/29/2018  PT End of Session - 08/29/18 1105    Visit Number  2    Number of Visits  17    Date for PT Re-Evaluation  10/17/18    PT Start Time  1030    PT Stop Time  1115    PT Time Calculation (min)  45 min    Activity Tolerance  Patient tolerated treatment well    Behavior During Therapy  [facility] for tasks assessed/performed       Past Medical History:  Diagnosis Date  . Anxiety   . Endometriosis   . Factor 5 Leiden mutation, heterozygous (HCC)   . Meniere disease   . Migraine   . PCO (polycystic ovaries)     Past Surgical History:  Procedure Laterality Date  . CESAREAN SECTION    . NASAL SINUS SURGERY    . PELVIC LAPAROSCOPY    . SMALL INTESTINE SURGERY      There were no vitals filed for this visit.  Subjective Assessment - 08/29/18 1034    Subjective  Patient reports she went back to MD who took Xrays for instability. Patient reports some pain relief with stretching, that her muscles are releasing.     Pertinent History  Patient is a 38 year old female presenting with L hip pain. Patient reports chronic history with LBP (spondylolesthesis) with L hip pain exacerbated "a couple months ago". Patient reports insideous onset with gradually worsening pain in L hip over the past couple months. Patient reports her pain is in a C shape from groin around to deep buttock that is a deep, annoying/nagging pain. Patient reports she has had burning/tingling sensation down posterior buttock to the bottom of the foot, that "comes and goes". Patient reports the she is having some "tingling down front of R thigh that is how the LLE pain started". Patient reports she  cannot sleep on the L side, or sit for extended periods periods. 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  Patient reports worst pain over past week 4/10 best 2/10.     Limitations  Sitting;House hold activities    How long can you sit comfortably?  5-10 min    How long can you stand comfortably?  10-1515mins    How long can you walk comfortably?  unlimited; feels better    Diagnostic tests  MRI and EMG negative for labrum tear and nerve issues    Patient Stated Goals  Be able to sleep and sit without pain    Pain Onset  More than a month ago       Manual STM with trigger point release to glute max/min/med, pectineus/psoas, and adductor magnus/longus  Manual ober and thomas stretch 1min Dry Needling: (4) 60mm .30 needles placed along the L pectineus and L glute med/max to decrease increased muscular spasms and trigger points with the patient positioned in supine and prone. Patient was educated on risks and benefits of therapy and verbally consents to PT. Good local twitch response at pectineus with femoral artery found prior  ESTIM + heat pack HiVolt ESTIM 10 min at patient tolerated 200V increased to 220V through treatment at L glute med area .  Attempted to decrease muscle tension in this area. With PT assessing patient tolerance throughout (increasing intensity as needed), monitoring skin integrity (normal), with decreased pain noted from patient    Ther-Ex - HEP review with minor correction for half kneeling pectineus stretch                      PT Education - 08/29/18 1105    Education Details  ESTIM and TDN education    Person(s) Educated  Patient    Methods  Explanation;Verbal cues    Comprehension  Verbalized understanding;Verbal cues required       PT Short Term Goals - 08/22/18 1214      PT SHORT TERM GOAL #1   Title  Pt will be independent with HEP in order to improve strength and balance in order to decrease fall risk and improve  function at home and work.    Time  8    Period  Weeks    Status  New        PT Long Term Goals - 08/22/18 1215      PT LONG TERM GOAL #1   Title  Pt will decrease worst pain as reported on NPRS by at least 3 points in order to demonstrate clinically significant reduction in pain.    Baseline  08/22/18 4/10    Time  8    Period  Weeks    Status  New      PT LONG TERM GOAL #2   Title  Patient will increase FOTO score to 69 to demonstrate predicted increase in functional mobility to complete ADLs    Baseline  08/22/18 67    Time  8    Period  Weeks    Status  New      PT LONG TERM GOAL #3   Title  Patient will report no pain with sitting in order to complete work duties with precision    Baseline  08/22/18 Sit for 5-10 mins with 4/10 pain and needing to stretch/stand    Time  8    Period  Weeks    Status  New            Plan - 08/29/18 1142    Clinical Impression Statement  Patient with increased ROm and decreased pain following TDN, with good localized twitch response with TDN. PT continued to utilize modalities for pain management and to increase soft tissue extensibility. Patient reports less strain with stretching following, and is able to demonstrate HEP with only need for minor corrective cuing. PT will continue to increase mobility and progress to core stability as able. PT hypothesizes that patient has increased muscle tension in hip to combat instability in low back, but will know more following Xray results.     Rehab Potential  Good    Clinical Impairments Affecting Rehab Potential  (+) age, lack of other comorbidities (-) sedentary lifestyle, chronicity of pain, multiple pain sites    PT Frequency  2x / week    PT Duration  8 weeks    PT Treatment/Interventions  Aquatic Therapy;Electrical Stimulation;Cryotherapy;Moist Heat;Iontophoresis 4mg /ml Dexamethasone;Ultrasound;Traction;Gait training;Stair training;Therapeutic activities;Therapeutic  exercise;Neuromuscular re-education;Functional mobility training;Balance training;Patient/family education;Manual techniques;Dry needling;Passive range of motion;Taping;Spinal Manipulations;Joint Manipulations    PT Next Visit Plan  Decrease soft tissue tension and core stability as able    PT Home Exercise Plan  adductor, glute med stretching    Consulted and Agree with Plan of Care  Patient  Patient will benefit from skilled therapeutic intervention in order to improve the following deficits and impairments:  Decreased activity tolerance, Decreased endurance, Decreased range of motion, Increased fascial restricitons, Improper body mechanics, Pain, Decreased mobility, Postural dysfunction, Impaired tone, Impaired flexibility, Increased muscle spasms  Visit Diagnosis: Pain in left hip     Problem List Patient Active Problem List   Diagnosis Date Noted  . Chronic lumbar radiculitis (S1) (Left) 08/23/2018  . Neurogenic pain 08/23/2018  . DDD (degenerative disc disease), lumbosacral 08/23/2018  . Osteoarthritis of hip (Left) 08/08/2018  . Abnormal MRI, lumbar spine (12/23/2017) 07/24/2018  . Lumbar facet arthropathy 07/24/2018  . Lumbar facet syndrome 07/24/2018  . Grade 1 Anterolisthesis L5/S1 07/24/2018  . Lumbar lateral recess stenosis 07/24/2018  . Lumbar foraminal stenosis 07/24/2018  . Lumbar nerve root impingement (L5) (Left) 07/24/2018  . Lumbar pars defect (L5) (Bilateral) 07/24/2018  . Lumbar L5 pars defect w/ spondylolisthesis 07/24/2018  . Chronic musculoskeletal pain 07/24/2018  . Chronic sacroiliac joint pain [facility] Area of Pain) (Right) 07/24/2018  . Family history of factor V Leiden mutation 06/29/2018  . GAD (generalized anxiety disorder) 06/29/2018  . Lyme disease 06/29/2018  . Major depression 06/29/2018  . Spondylosis 06/29/2018  . Vaginal infection 06/29/2018  . Chronic groin pain (Primary Area of Pain) (Left) 06/29/2018  . Chronic hip pain (Secondary  Area of Pain) (Left) 06/29/2018  . Chronic low back pain (Fourth Area of Pain) (Bilateral) (L>R) w/ sciatica (Bilateral) 06/29/2018  . Chronic lower extremity pain (Bilateral) (L>R) 06/29/2018  . Chronic pain syndrome 06/29/2018  . Pharmacologic therapy 06/29/2018  . Disorder of skeletal system 06/29/2018  . Problems influencing health status 06/29/2018  . Endometriosis 01/10/2018  . Meniere's disease of right ear 06/14/2017  . Allergic rhinitis due to animal hair and dander 02/15/2017  . Dysfunction of right eustachian tube 02/15/2017  . Hearing loss, sensorineural, asymmetrical 02/15/2017  . Vestibular dizziness 02/15/2017  . Insomnia 06/17/2014  . Snoring 06/17/2014  . DDD (degenerative disc disease), cervical 05/21/2014  . Migraines 05/21/2014  . Spondylolisthesis at L5-S1 level 05/21/2014   Staci Acosta PT, DPT Staci Acosta 08/29/2018, 12:52 PM  [facility] [employer] [facility] SPORTS MEDICINE [address] [place], [address] Phone: [phone]   Fax:  [phone]  Name: Amber Reed MRN: [id number] Date of Birth: December 22, 1980

## 2018-08-31 ENCOUNTER — Encounter: Payer: Self-pay | Admitting: Pain Medicine

## 2018-08-31 ENCOUNTER — Ambulatory Visit: Payer: Managed Care, Other (non HMO) | Admitting: Physical Therapy

## 2018-08-31 ENCOUNTER — Ambulatory Visit (HOSPITAL_BASED_OUTPATIENT_CLINIC_OR_DEPARTMENT_OTHER): Payer: 59 | Admitting: Pain Medicine

## 2018-08-31 ENCOUNTER — Ambulatory Visit
Admission: RE | Admit: 2018-08-31 | Discharge: 2018-08-31 | Disposition: A | Discharge status: Home / Self Care | Payer: 59 | Source: Ambulatory Visit | Attending: Pain Medicine | Admitting: Pain Medicine

## 2018-08-31 ENCOUNTER — Other Ambulatory Visit: Payer: Self-pay

## 2018-08-31 VITALS — BP 115/65 | HR 85 | Temp 98.7°F | Resp 16 | Ht 71.0 in | Wt 195.0 lb

## 2018-08-31 DIAGNOSIS — M4317 Spondylolisthesis, lumbosacral region: Secondary | ICD-10-CM

## 2018-08-31 DIAGNOSIS — G8929 Other chronic pain: Secondary | ICD-10-CM

## 2018-08-31 DIAGNOSIS — M5442 Lumbago with sciatica, left side: Secondary | ICD-10-CM | POA: Insufficient documentation

## 2018-08-31 DIAGNOSIS — M79605 Pain in left leg: Secondary | ICD-10-CM | POA: Insufficient documentation

## 2018-08-31 DIAGNOSIS — M5137 Other intervertebral disc degeneration, lumbosacral region: Secondary | ICD-10-CM | POA: Diagnosis present

## 2018-08-31 DIAGNOSIS — M5416 Radiculopathy, lumbar region: Secondary | ICD-10-CM | POA: Diagnosis present

## 2018-08-31 DIAGNOSIS — M5441 Lumbago with sciatica, right side: Secondary | ICD-10-CM | POA: Diagnosis present

## 2018-08-31 DIAGNOSIS — M532X6 Spinal instabilities, lumbar region: Secondary | ICD-10-CM | POA: Diagnosis present

## 2018-08-31 DIAGNOSIS — M79604 Pain in right leg: Secondary | ICD-10-CM

## 2018-08-31 DIAGNOSIS — M4306 Spondylolysis, lumbar region: Secondary | ICD-10-CM

## 2018-08-31 DIAGNOSIS — M4300 Spondylolysis, site unspecified: Secondary | ICD-10-CM

## 2018-08-31 DIAGNOSIS — M4310 Spondylolisthesis, site unspecified: Secondary | ICD-10-CM | POA: Insufficient documentation

## 2018-08-31 MED ORDER — ROPIVACAINE HCL 2 MG/ML IJ SOLN
1.0000 mL | Freq: Once | INTRAMUSCULAR | Status: AC
  Administered 2018-08-31: 3 mL via EPIDURAL

## 2018-08-31 MED ORDER — SODIUM CHLORIDE 0.9% FLUSH: 1.0000 mL | Freq: Once | INTRAVENOUS | Status: DC

## 2018-08-31 MED ORDER — LIDOCAINE HCL 2 % IJ SOLN
20.0000 mL | Freq: Once | INTRAMUSCULAR | Status: AC
  Administered 2018-08-31: 400 mg
  Filled 2018-08-31: qty 40

## 2018-08-31 MED ORDER — ROPIVACAINE HCL 2 MG/ML IJ SOLN
1.0000 mL | Freq: Once | INTRAMUSCULAR | Status: DC
  Filled 2018-08-31: qty 10

## 2018-08-31 MED ORDER — LACTATED RINGERS IV SOLN
1000.0000 mL | Freq: Once | INTRAVENOUS | Status: AC
  Administered 2018-08-31: 1000 mL via INTRAVENOUS

## 2018-08-31 MED ORDER — SODIUM CHLORIDE 0.9% FLUSH: 2.0000 mL | Freq: Once | INTRAVENOUS | Status: DC

## 2018-08-31 MED ORDER — DEXAMETHASONE SODIUM PHOSPHATE 10 MG/ML IJ SOLN
10.0000 mg | Freq: Once | INTRAMUSCULAR | Status: AC
  Administered 2018-08-31: 10 mg
  Filled 2018-08-31: qty 1

## 2018-08-31 MED ORDER — FENTANYL CITRATE (PF) 100 MCG/2ML IJ SOLN
25.0000 ug | INTRAMUSCULAR | Status: DC | PRN
  Administered 2018-08-31: 100 ug via INTRAVENOUS
  Filled 2018-08-31: qty 2

## 2018-08-31 MED ORDER — MIDAZOLAM HCL 5 MG/5ML IJ SOLN
1.0000 mg | INTRAMUSCULAR | Status: DC | PRN
  Administered 2018-08-31: 4 mg via INTRAVENOUS
  Filled 2018-08-31: qty 5

## 2018-08-31 MED ORDER — SODIUM CHLORIDE 0.9% FLUSH
1.0000 mL | Freq: Once | INTRAVENOUS | Status: AC
  Administered 2018-08-31: 3 mL

## 2018-08-31 MED ORDER — SODIUM CHLORIDE (PF) 0.9 % IJ SOLN
INTRAMUSCULAR | Status: AC
  Filled 2018-08-31: qty 10

## 2018-08-31 MED ORDER — ROPIVACAINE HCL 2 MG/ML IJ SOLN: 2.0000 mL | Freq: Once | INTRAMUSCULAR | Status: DC

## 2018-08-31 MED ORDER — IOPAMIDOL (ISOVUE-M 200) INJECTION 41%
10.0000 mL | Freq: Once | INTRAMUSCULAR | Status: AC
  Administered 2018-08-31: 10 mL via EPIDURAL
  Filled 2018-08-31: qty 10

## 2018-08-31 MED ORDER — TRIAMCINOLONE ACETONIDE 40 MG/ML IJ SUSP
40.0000 mg | Freq: Once | INTRAMUSCULAR | Status: AC
  Administered 2018-08-31: 40 mg
  Filled 2018-08-31: qty 1

## 2018-08-31 NOTE — Progress Notes (Signed)
Patient's Name: Amber Reed  MRN: [id number]  Referring Provider: Delano Metz, MD  DOB: 05-Aug-1980  PCP: Yates Decamp, MD  DOS: 08/31/2018  Note by: Oswaldo Done, MD  Service setting: Ambulatory outpatient  Specialty: Interventional Pain Management  Patient type: Established  Location: [facility] (AMB) Pain Management Facility  Visit type: Interventional Procedure   Primary Reason for Visit: Interventional Pain Management Treatment. CC: Back Pain (lower)  Procedure #1:  Anesthesia, Analgesia, Anxiolysis:  Type: Therapeutic Trans-Foraminal Epidural Steroid Injection   #1  Region: Lumbar Level: L5 & S1 Paravertebral Laterality: Left-Sided Paravertebral   Type: Moderate (Conscious) Sedation combined with Local Anesthesia Indication(s): Analgesia and Anxiety Route: Intravenous (IV) IV Access: Secured Sedation: Meaningful verbal contact was maintained at all times during the procedure  Local Anesthetic: Lidocaine 1-2%  Position: Prone  Procedure #2:    Type: Therapeutic Inter-Laminar Epidural Steroid Injection   #1  Region: Lumbar Level: L4-5 Level. Laterality: Right Paramedial     Indications: 1. DDD (degenerative disc disease), lumbosacral   2. Lumbar nerve root impingement (L5) (Left)   3. Grade 1 Anterolisthesis L5/S1   4. Chronic lumbar radiculitis (S1) (Left)   5. Chronic lower extremity pain (Bilateral) (L>R)   6. 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 (year old), female patient, seen today for interventional treatment. She  has a past surgical history that includes Cesarean section; Nasal sinus surgery; Pelvic laparoscopy; and Small intestine surgery. Amber Reed has a current medication list which includes the following prescription(s): betahistine dihydrochloride, cyanocobalamin-methylcobalamin,  diclofenac, gabapentin, hydrochlorothiazide, methocarbamol, ondansetron, vitamin e, and azelastine, and the following Facility-Administered Medications: fentanyl, midazolam, ropivacaine (pf) 2 mg/ml (0.2%), ropivacaine (pf) 2 mg/ml (0.2%), sodium chloride flush, and sodium chloride flush. Her primarily concern today is the Back Pain (lower)  Initial Vital Signs:  Pulse/HCG Rate: 85ECG Heart Rate: 80 Temp: 98.7 F (37.1 C) Resp: 16 BP: 116/77 SpO2: 100 %  BMI: Estimated body mass index is 27.2 kg/m as calculated from the following:   Height as of this encounter: 5\' 11"  (1.803 m).   Weight as of this encounter: 195 lb (88.5 kg).  Risk Assessment: Allergies: Reviewed. She is allergic to other and amoxicillin.  Allergy Precautions: None required Coagulopathies: Reviewed. None identified.  Blood-thinner therapy: None at this time Active Infection(s): Reviewed. None identified. Amber Reed is afebrile  Site Confirmation: Amber Reed was asked to confirm the procedure and laterality before marking the site Procedure checklist: Completed Consent: Before the procedure and under the influence of no sedative(s), amnesic(s), or anxiolytics, the patient was informed of the treatment options, risks and possible complications. To fulfill our ethical and legal obligations, as recommended by the American Medical Association's Code of Ethics, I have informed the patient of my clinical impression; the nature and purpose of the treatment or procedure; the risks, benefits, and possible complications of the intervention; the alternatives, including doing nothing; the risk(s) and benefit(s) of the alternative treatment(s) or procedure(s); and the risk(s) and benefit(s) of doing nothing. The patient was provided information about the general risks and possible complications associated with the procedure. These may include, but are not limited to: failure to achieve desired goals, infection, bleeding, organ or nerve  damage, allergic reactions, paralysis, and death. In addition, the patient was informed of those risks and complications associated to [facility] procedures, such as  failure to decrease pain; infection (i.e.: Meningitis, epidural or intraspinal abscess); bleeding (i.e.: epidural hematoma, subarachnoid hemorrhage, or any other type of intraspinal or peri-dural bleeding); organ or nerve damage (i.e.: Any type of peripheral nerve, nerve root, or spinal cord injury) with subsequent damage to sensory, motor, and/or autonomic systems, resulting in permanent pain, numbness, and/or weakness of one or several areas of the body; allergic reactions; (i.e.: anaphylactic reaction); and/or death. Furthermore, the patient was informed of those risks and complications associated with the medications. These include, but are not limited to: allergic reactions (i.e.: anaphylactic or anaphylactoid reaction(s)); adrenal axis suppression; blood sugar elevation that in diabetics may result in ketoacidosis or comma; water retention that in patients with history of congestive heart failure may result in shortness of breath, pulmonary edema, and decompensation with resultant heart failure; weight gain; swelling or edema; medication-induced neural toxicity; particulate matter embolism and blood vessel occlusion with resultant organ, and/or nervous system infarction; and/or aseptic necrosis of one or more joints. Finally, the patient was informed that Medicine is not an exact science; therefore, there is also the possibility of unforeseen or unpredictable risks and/or possible complications that may result in a catastrophic outcome. The patient indicated having understood very clearly. We have given the patient no guarantees and we have made no promises. Enough time was given to the patient to ask questions, all of which were answered to the patient's satisfaction. Amber Reed has indicated that she wanted to continue with the  procedure. Attestation: I, the ordering provider, attest that I have discussed with the patient the benefits, risks, side-effects, alternatives, likelihood of achieving goals, and potential problems during recovery for the procedure that I have provided informed consent. Date  Time: 08/31/2018  8:15 AM  Pre-Procedure Preparation:  Monitoring: As per clinic protocol. Respiration, ETCO2, SpO2, BP, heart rate and rhythm monitor placed and checked for adequate function Safety Precautions: Patient was assessed for positional comfort and pressure points before starting the procedure. Time-out: I initiated and conducted the "Time-out" before starting the procedure, as per protocol. The patient was asked to participate by confirming the accuracy of the "Time Out" information. Verification of the correct person, site, and procedure were performed and confirmed by me, the nursing staff, and the patient. "Time-out" conducted as per Joint Commission's Universal Protocol (UP.01.01.01). Time: [id number]  Description of Procedure #1:  Target Area: The inferior and lateral portion of the pedicle, just lateral to a line created by the 6:00 position of the pedicle and the superior articular process of the vertebral body below. On the lateral view, this target lies just posterior to the anterior aspect of the lamina and posterior to the midpoint created between the anterior and the posterior aspect of the neural foramina. Approach: Posterior paravertebral approach. Area Prepped: Entire Posterior Lumbosacral Region Prepping solution: ChloraPrep (2% chlorhexidine gluconate and 70% isopropyl alcohol) Safety Precautions: Aspiration looking for blood return was conducted prior to all injections. At no point did we inject any substances, as a needle was being advanced. No attempts were made at seeking any paresthesias. Safe injection practices and needle disposal techniques used. Medications properly checked for expiration dates.  SDV (single dose vial) medications used.  Description of the Procedure: Protocol guidelines were followed. The patient was placed in position over the fluoroscopy table. The target area was identified and the area prepped in the usual manner. Skin & deeper tissues infiltrated with local anesthetic. Appropriate amount of time allowed to pass for local anesthetics to take effect. The  procedure needles were then advanced to the target area. Proper needle placement secured. Negative aspiration confirmed. Solution injected in intermittent fashion, asking for systemic symptoms every 0.2cc of injectate. The needles were then removed and the area cleansed, making sure to leave some of the prepping solution back to take advantage of its long term bactericidal properties.  Start Time: 0854 hrs.  Materials:  Needle(s) Type: Spinal Needle Gauge: 22G Length: 3.5-in Medication(s): Please see orders for medications and dosing details.  Description of Procedure #2:  Target Area: The  interlaminar space, initially targeting the lower border of the superior vertebral body lamina. Approach: Posterior paramedial approach. Area Prepped: Same as above Prepping solution: Same as above Safety Precautions: Same as above  Description of the Procedure: Protocol guidelines were followed. The patient was placed in position over the fluoroscopy table. The target area was identified and the area prepped in the usual manner. Skin & deeper tissues infiltrated with local anesthetic. Appropriate amount of time allowed to pass for local anesthetics to take effect. The procedure needle was introduced through the skin, ipsilateral to the reported pain, and advanced to the target area. Bone was contacted and the needle walked caudad, until the lamina was cleared. The ligamentum flavum was engaged and loss-of-resistance technique used as the epidural needle was advanced. The epidural space was identified using "loss-of-resistance  technique" with 2-3 ml of PF-NaCl (0.9% NSS), in a 5cc LOR glass syringe. Proper needle placement secured. Negative aspiration confirmed. Solution injected in intermittent fashion, asking for systemic symptoms every 0.5cc of injectate. The needles were then removed and the area cleansed, making sure to leave some of the prepping solution back to take advantage of its long term bactericidal properties.  Vitals:   08/31/18 0911 08/31/18 0920 08/31/18 0930 08/31/18 0939  BP: 117/78 120/65 124/65 115/65  Pulse:      Resp: 10 12 14 16   Temp:      TempSrc:      SpO2: 91% 94% 96% 98%  Weight:      Height:        End Time: 0909 hrs.  Materials:  Needle(s) Type: Epidural needle Gauge: 17G Length: 3.5-in Medication(s): Please see orders for medications and dosing details.  Imaging Guidance (Spinal):          Type of Imaging Technique: Fluoroscopy Guidance (Spinal) Indication(s): Assistance in needle guidance and placement for procedures requiring needle placement in or near specific anatomical locations not easily accessible without such assistance. Exposure Time: Please see nurses notes. Contrast: Before injecting any contrast, we confirmed that the patient did not have an allergy to iodine, shellfish, or radiological contrast. Once satisfactory needle placement was completed at the desired level, radiological contrast was injected. Contrast injected under live fluoroscopy. No contrast complications. See chart for type and volume of contrast used. Fluoroscopic Guidance: I was personally present during the use of fluoroscopy. "Tunnel Vision Technique" used to obtain the best possible view of the target area. Parallax error corrected before commencing the procedure. "Direction-depth-direction" technique used to introduce the needle under continuous pulsed fluoroscopy. Once target was reached, antero-posterior, oblique, and lateral fluoroscopic projection used confirm needle placement in all planes.  Images permanently stored in EMR. Interpretation: I personally interpreted the imaging intraoperatively. Adequate needle placement confirmed in multiple planes. Appropriate spread of contrast into desired area was observed. No evidence of afferent or efferent intravascular uptake. No intrathecal or subarachnoid spread observed. Permanent images saved into the patient's record.  Antibiotic Prophylaxis:   Anti-infectives (From  admission, onward)   None     Indication(s): None identified  Post-operative Assessment:  Post-procedure Vital Signs:  Pulse/HCG Rate: 8585 Temp: 98.7 F (37.1 C) Resp: 16 BP: 115/65 SpO2: 98 %  EBL: None  Complications: No immediate post-treatment complications observed by team, or reported by patient.  Note: The patient tolerated the entire procedure well. A repeat set of vitals were taken after the procedure and the patient was kept under observation following institutional policy, for this type of procedure. Post-procedural neurological assessment was performed, showing return to baseline, prior to discharge. The patient was provided with post-procedure discharge instructions, including a section on how to identify potential problems. Should any problems arise concerning this procedure, the patient was given instructions to immediately contact us, at any time, without hesitation. In any case, we plan to contact the patient by telephone for a follow-up status report regarding this interventional procedure.  Comments:  No additional relevant information.  Plan of Care    Imaging Orders     DG C-Arm 1-60 Min-No Report  Procedure Orders     Lumbar Epidural Injection     Lumbar Transforaminal Epidural  Medications ordered for procedure: Meds ordered this encounter  Medications  . iopamidol (ISOVUE-M) 41 % intrathecal injection 10 mL    Must be Myelogram-compatible. If not available, you may substitute with a water-soluble, non-ionic, hypoallergenic,  myelogram-compatible radiological contrast medium.  Marland Kitchen lidocaine (XYLOCAINE) 2 % (with pres) injection 400 mg  . midazolam (VERSED) 5 MG/5ML injection 1-2 mg    Make sure Flumazenil is available in the pyxis when using this medication. If oversedation occurs, administer 0.2 mg IV over 15 sec. If after 45 sec no response, administer 0.2 mg again over 1 min; may repeat at 1 min intervals; not to exceed 4 doses (1 mg)  . fentaNYL (SUBLIMAZE) injection 25-50 mcg    Make sure Narcan is available in the pyxis when using this medication. In the event of respiratory depression (RR< 8/min): Titrate NARCAN (naloxone) in increments of 0.1 to 0.2 mg IV at 2-3 minute intervals, until desired degree of reversal.  . lactated ringers infusion 1,000 mL  . sodium chloride flush (NS) 0.9 % injection 1 mL  . ropivacaine (PF) 2 mg/mL (0.2%) (NAROPIN) injection 1 mL  . dexamethasone (DECADRON) injection 10 mg  . sodium chloride flush (NS) 0.9 % injection 1 mL  . ropivacaine (PF) 2 mg/mL (0.2%) (NAROPIN) injection 1 mL  . dexamethasone (DECADRON) injection 10 mg  . sodium chloride flush (NS) 0.9 % injection 2 mL  . ropivacaine (PF) 2 mg/mL (0.2%) (NAROPIN) injection 2 mL  . triamcinolone acetonide (KENALOG-40) injection 40 mg   Medications administered: We administered iopamidol, lidocaine, midazolam, fentaNYL, lactated ringers, dexamethasone, sodium chloride flush, ropivacaine (PF) 2 mg/mL (0.2%), dexamethasone, and triamcinolone acetonide.  See the medical record for exact dosing, route, and time of administration.  Disposition: Discharge home  Discharge Date & Time: 08/31/2018; 0940 hrs.   Physician-requested Follow-up: Return for PPE (2 wks), w/ Dr. Laban Emperor.  Future Appointments  Date Time Provider Department Center  08/31/2018 10:30 AM Staci Acosta, PT [facility] None  09/05/2018 10:30 AM Staci Acosta, PT [facility] None  09/07/2018 10:30 AM Staci Acosta, PT [facility] None  09/12/2018 10:30 AM Staci Acosta, PT [facility] None  09/13/2018  2:00 PM Delano Metz, MD [facility] None  09/14/2018 10:30 AM Staci Acosta, PT [facility] None  09/19/2018 10:30 AM Staci Acosta, PT [facility] None  09/21/2018 11:15 AM Staci Acosta, PT [facility]  None  09/26/2018 11:15 AM Staci Acosta, PT [facility] None  09/28/2018 11:15 AM Staci Acosta, PT [facility] None  10/03/2018 11:15 AM Staci Acosta, PT [facility] None  10/05/2018 11:15 AM Staci Acosta, PT [facility] None  10/10/2018 11:15 AM Staci Acosta, PT [facility] None  10/12/2018 11:15 AM Staci Acosta, PT [facility] None   Primary Care Physician: Yates Decamp, MD Location: [facility] Note by: Oswaldo Done, MD Date: 08/31/2018; Time: 10:25 AM  Disclaimer:  Medicine is not an Visual merchandiser. The only guarantee in medicine is that nothing is guaranteed. It is important to note that the decision to proceed with this intervention was based on the information collected from the patient. The Data and conclusions were drawn from the patient's questionnaire, the interview, and the physical examination. Because the information was provided in large part by the patient, it cannot be guaranteed that it has not been purposely or unconsciously manipulated. Every effort has been made to obtain as much relevant data as possible for this evaluation. It is important to note that the conclusions that lead to this procedure are derived in large part from the available data. Always take into account that the treatment will also be dependent on availability of resources and existing treatment guidelines, considered by other Pain Management Practitioners as being common knowledge and practice, at the time of the intervention. For Medico-Legal purposes, it is also important to point out that variation in procedural techniques and pharmacological choices are the acceptable norm. The indications, contraindications, technique, and results  of the above procedure should only be interpreted and judged by a Board-Certified Interventional Pain Specialist with extensive familiarity and expertise in the same exact procedure and technique.

## 2018-08-31 NOTE — Progress Notes (Signed)
Safety precautions to be maintained throughout the outpatient stay will include: orient to surroundings, keep bed in low position, maintain call bell within reach at all times, provide assistance with transfer out of bed and ambulation.  

## 2018-08-31 NOTE — Patient Instructions (Addendum)
____________________________________________________________________________________________  Post-Procedure Discharge Instructions  Instructions:  Apply ice: Fill a plastic sandwich bag with crushed ice. Cover it with a small towel and apply to injection site. Apply for 15 minutes then remove x 15 minutes. Repeat sequence on day of procedure, until you go to bed. The purpose is to minimize swelling and discomfort after procedure.  Apply heat: Apply heat to procedure site starting the day following the procedure. The purpose is to treat any soreness and discomfort from the procedure.  Food intake: Start with clear liquids (like water) and advance to regular food, as tolerated.   Physical activities: Keep activities to a minimum for the first 8 hours after the procedure.   Driving: If you have received any sedation, you are not allowed to drive for 24 hours after your procedure.  Blood thinner: Restart your blood thinner 6 hours after your procedure. (Only for those taking blood thinners)  Insulin: As soon as you can eat, you may resume your normal dosing schedule. (Only for those taking insulin)  Infection prevention: Keep procedure site clean and dry.  Post-procedure Pain Diary: Extremely important that this be done correctly and accurately. Recorded information will be used to determine the next step in treatment.  Pain evaluated is that of treated area only. Do not include pain from an untreated area.  Complete every hour, on the hour, for the initial 8 hours. Set an alarm to help you do this part accurately.  Do not go to sleep and have it completed later. It will not be accurate.  Follow-up appointment: Keep your follow-up appointment after the procedure. Usually 2 weeks for most procedures. (6 weeks in the case of radiofrequency.) Bring you pain diary.   Expect:  From numbing medicine (AKA: Local Anesthetics): Numbness or decrease in pain.  Onset: Full effect within 15  minutes of injected.  Duration: It will depend on the type of local anesthetic used. On the average, 1 to 8 hours.   From steroids: Decrease in swelling or inflammation. Once inflammation is improved, relief of the pain will follow.  Onset of benefits: Depends on the amount of swelling present. The more swelling, the longer it will take for the benefits to be seen. In some cases, up to 10 days.  Duration: Steroids will stay in the system x 2 weeks. Duration of benefits will depend on multiple posibilities including persistent irritating factors.  Occasional side-effects: Facial flushing (red, warm cheeks) , cramps (if present, drink Gatorade and take over-the-counter Magnesium 450-500 mg once to twice a day).  From procedure: Some discomfort is to be expected once the numbing medicine wears off. This should be minimal if ice and heat are applied as instructed.  Call if:  You experience numbness and weakness that gets worse with time, as opposed to wearing off.  New onset bowel or bladder incontinence. (This applies to Spinal procedures only)  Emergency Numbers:  Durning business hours (Monday - Thursday, 8:00 AM - 4:00 PM) (Friday, 9:00 AM - 12:00 Noon): [phone]  After hours: [phone] ____________________________________________________________________________________________   Pain Management Discharge Instructions  General Discharge Instructions :  If you need to reach your doctor call: Monday-Friday 8:00 am - 4:00 pm at [phone] or toll free [phone].  After clinic hours [phone] to have operator reach doctor.  Bring all of your medication bottles to all your appointments in the pain clinic.  To cancel or reschedule your appointment with Pain Management please remember to call 24 hours in advance to   avoid a fee.  Refer to the educational materials which you have been given on: General Risks, I had my Procedure. Discharge Instructions, Post  Sedation.  Post Procedure Instructions:  The drugs you were given will stay in your system until tomorrow, so for the next 24 hours you should not drive, make any legal decisions or drink any alcoholic beverages.  You may eat anything you prefer, but it is better to start with liquids then soups and crackers, and gradually work up to solid foods.  Please notify your doctor immediately if you have any unusual bleeding, trouble breathing or pain that is not related to your normal pain.  Depending on the type of procedure that was done, some parts of your body may feel week and/or numb.  This usually clears up by tonight or the next day.  Walk with the use of an assistive device or accompanied by an adult for the 24 hours.  You may use ice on the affected area for the first 24 hours.  Put ice in a Ziploc bag and cover with a towel and place against area 15 minutes on 15 minutes off.  You may switch to heat after 24 hours.Selective Nerve Root Block Patient Information  Description: Specific nerve roots exit the spinal canal and these nerves can be compressed and inflamed by a bulging disc and bone spurs.  By injecting steroids on the nerve root, we can potentially decrease the inflammation surrounding these nerves, which often leads to decreased pain.  Also, by injecting local anesthesia on the nerve root, this can provide us helpful information to give to your referring doctor if it decreases your pain.  Selective nerve root blocks can be done along the spine from the neck to the low back depending on the location of your pain.   After numbing the skin with local anesthesia, a small needle is passed to the nerve root and the position of the needle is verified using x-ray pictures.  After the needle is in correct position, we then deposit the medication.  You may experience a pressure sensation while this is being done.  The entire block usually lasts less than 15 minutes.  Conditions that may be  treated with selective nerve root blocks:  Low back and leg pain  Spinal stenosis  Diagnostic block prior to potential surgery  Neck and arm pain  Post laminectomy syndrome  Preparation for the injection:  1. Do not eat any solid food or dairy products within 8 hours of your appointment. 2. You may drink clear liquids up to 3 hours before an appointment.  Clear liquids include water, black coffee, juice or soda.  No milk or cream please. 3. You may take your regular medications, including pain medications, with a sip of water before your appointment.  Diabetics should hold regular insulin (if taken separately) and take 1/2 normal NPH dose the morning of the procedure.  Carry some sugar containing items with you to your appointment. 4. A driver must accompany you and be prepared to drive you home after your procedure. 5. Bring all your current medications with you. 6. An IV may be inserted and sedation may be given at the discretion of the physician. 7. A blood pressure cuff, EKG, and other monitors will often be applied during the procedure.  Some patients may need to have extra oxygen administered for a short period. 8. You will be asked to provide medical information, including allergies, prior to the procedure.  We must  know immediately if you are taking blood  Thinners (like Coumadin) or if you are allergic to IV iodine contrast (dye).  Possible side-effects: All are usually temporary  Bleeding from needle site  Light headedness  Numbness and tingling  Decreased blood pressure  Weakness in arms/legs  Pressure sensation in back/neck  Pain at injection site (several days)  Possible complications: All are extremely rare  Infection  Nerve injury  Spinal headache (a headache wore with upright position)  Call if you experience:  Fever/chills associated with headache or increased back/neck pain  Headache worsened by an upright position  New onset weakness or  numbness of an extremity below the injection site  Hives or difficulty breathing (go to the emergency room)  Inflammation or drainage at the injection site(s)  Severe back/neck pain greater than usual  New symptoms which are concerning to you  Please note:  Although the local anesthetic injected can often make your back or neck feel good for several hours after the injection the pain will likely return.  It takes 3-5 days for steroids to work on the nerve root. You may not notice any pain relief for at least one week.  If effective, we will often do a series of 3 injections spaced 3-6 weeks apart to maximally decrease your pain.    If you have any questions, please call [phone] [facility] Clinic[facility] Patient Information  Description: The epidural space surrounds the nerves as they exit the spinal cord.  In some patients, the nerves can be compressed and inflamed by a bulging disc or a tight spinal canal (spinal stenosis).  By injecting steroids into the epidural space, we can bring irritated nerves into direct contact with a potentially helpful medication.  These steroids act directly on the irritated nerves and can reduce swelling and inflammation which often leads to decreased pain.  Epidural steroids may be injected anywhere along the spine and from the neck to the low back depending upon the location of your pain.   After numbing the skin with local anesthetic (like Novocaine), a small needle is passed into the epidural space slowly.  You may experience a sensation of pressure while this is being done.  The entire block usually last less than 10 minutes.  Conditions which may be treated by epidural steroids:   Low back and leg pain  Neck and arm pain  Spinal stenosis  Post-laminectomy syndrome  Herpes zoster (shingles) pain  Pain from compression fractures  Preparation for the injection:  9. Do not eat any solid food or  dairy products within 8 hours of your appointment.  10. You may drink clear liquids up to 3 hours before appointment.  Clear liquids include water, black coffee, juice or soda.  No milk or cream please. 11. You may take your regular medication, including pain medications, with a sip of water before your appointment  Diabetics should hold regular insulin (if taken separately) and take 1/2 normal NPH dos the morning of the procedure.  Carry some sugar containing items with you to your appointment. 12. A driver must accompany you and be prepared to drive you home after your procedure.  13. Bring all your current medications with your. 14. An IV may be inserted and sedation may be given at the discretion of the physician.   15. A blood pressure cuff, EKG and other monitors will often be applied during the procedure.  Some patients may need to have extra oxygen administered  for a short period. 16. You will be asked to provide medical information, including your allergies, prior to the procedure.  We must know immediately if you are taking blood thinners (like Coumadin/Warfarin)  Or if you are allergic to IV iodine contrast (dye). We must know if you could possible be pregnant.  Possible side-effects:  Bleeding from needle site  Infection (rare, may require surgery)  Nerve injury (rare)  Numbness & tingling (temporary)  Difficulty urinating (rare, temporary)  Spinal headache ( a headache worse with upright posture)  Light -headedness (temporary)  Pain at injection site (several days)  Decreased blood pressure (temporary)  Weakness in arm/leg (temporary)  Pressure sensation in back/neck (temporary)  Call if you experience:  Fever/chills associated with headache or increased back/neck pain.  Headache worsened by an upright position.  New onset weakness or numbness of an extremity below the injection site  Hives or difficulty breathing (go to the emergency room)  Inflammation or  drainage at the infection site  Severe back/neck pain  Any new symptoms which are concerning to you  Please note:  Although the local anesthetic injected can often make your back or neck feel good for several hours after the injection, the pain will likely return.  It takes 3-7 days for steroids to work in the epidural space.  You may not notice any pain relief for at least that one week.  If effective, we will often do a series of three injections spaced 3-6 weeks apart to maximally decrease your pain.  After the initial series, we generally will wait several months before considering a repeat injection of the same type.  If you have any questions, please call [phone] [facility]

## 2018-09-01 NOTE — Telephone Encounter (Signed)
Spoke with patient.  She states she had a bad headache yesterday.  She states it is some better today. Patient states it does seem to ease when she lays down.  Instructed patient that if headache gets severe again and improves when she lays down to call us and if we are not here to have the operator page Dr Laban Emperor.

## 2018-09-05 ENCOUNTER — Encounter: Payer: Self-pay | Admitting: Physical Therapy

## 2018-09-05 ENCOUNTER — Ambulatory Visit: Payer: Managed Care, Other (non HMO) | Admitting: Physical Therapy

## 2018-09-05 DIAGNOSIS — M25552 Pain in left hip: Secondary | ICD-10-CM | POA: Diagnosis not present

## 2018-09-05 NOTE — Therapy (Signed)
[place] [facility] PHYSICAL AND SPORTS MEDICINE [address] [place], [address] Phone: [phone]   Fax:  [phone]  Physical Therapy Treatment  Patient Details  Name: Amber Reed MRN: [id number] Date of Birth: [birth year] Referring Provider (PT): Laban Emperor    Encounter Date: 09/05/2018  PT End of Session - 09/05/18 1107    Visit Number  3    Number of Visits  17    Date for PT Re-Evaluation  10/17/18    PT Start Time  1030    PT Stop Time  1115    PT Time Calculation (min)  45 min    Activity Tolerance  Patient tolerated treatment well    Behavior During Therapy  [facility] for tasks assessed/performed       Past Medical History:  Diagnosis Date  . Anxiety   . Endometriosis   . Factor 5 Leiden mutation, heterozygous (HCC)   . Meniere disease   . Migraine   . PCO (polycystic ovaries)     Past Surgical History:  Procedure Laterality Date  . CESAREAN SECTION    . NASAL SINUS SURGERY    . PELVIC LAPAROSCOPY    . SMALL INTESTINE SURGERY      There were no vitals filed for this visit.  Subjective Assessment - 09/05/18 1034    Subjective  Patient reports she had injection Thursday which has helped low back pain "a little" and reports Xrays revealed grade 1 spondylolesthesis L4-5 and L5-S1. Patient reports some hip pain 2/10 in the same front and posterior area that improved post TDN.     Pertinent History  Patient is a 38 year old female presenting with L hip pain. Patient reports chronic history with LBP (spondylolesthesis) with L hip pain exacerbated "a couple months ago". Patient reports insideous onset with gradually worsening pain in L hip over the past couple months. Patient reports her pain is in a C shape from groin around to deep buttock that is a deep, annoying/nagging pain. Patient reports she has had burning/tingling sensation down posterior buttock to the bottom of the foot, that "comes and goes". Patient reports the she is  having some "tingling down front of R thigh that is how the LLE pain started". Patient reports she cannot sleep on the L side, or sit for extended periods periods. 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  Patient reports worst pain over past week 4/10 best 2/10.     Limitations  Sitting;House hold activities    How long can you sit comfortably?  5-10 min    How long can you stand comfortably?  10-29mins    How long can you walk comfortably?  unlimited; feels better    Diagnostic tests  MRI and EMG negative for labrum tear and nerve issues    Patient Stated Goals  Be able to sleep and sit without pain    Pain Onset  More than a month ago         Manual STM with trigger point release to glute max/min/med, pectineus/psoas, and adductor magnus/longus  Dry Needling: (4) 37mm .30 needles placed along the L pectineus and L glute med/max to decrease increased muscular spasms and trigger points with the patient positioned in supine and prone. Patient was educated on risks and benefits of therapy and verbally consents to PT. Good local twitch response at pectineus with femoral artery found prior   Ther-Ex - Thomas stretch 45sec hold  -  Sidelying hip abd 3x 10 with min cuing to prevent hip rotation  - Posterior pelvic tilt x10 3sec hold for initial core activation motor control with hand under patient's low back for TC  - TA marching 2x 10 with min cuing initially for maintained core contraction with small foot lift with good carry over following - Glute bridge 2x 10with mod cuing initially to prevent LB ext with maintained core contraction with good carry over following - Squat x10 with elevated mat table for ROM and max cuing initially for proper from with maintained core contraction and hip activation with good carry over for 10 reps following                     PT Education - 09/05/18 1049    Education Details  TDN education; exercise form    Person(s)  Educated  Patient    Methods  Explanation;Demonstration;Verbal cues;Tactile cues    Comprehension  Verbalized understanding;Returned demonstration;Verbal cues required;Tactile cues required       PT Short Term Goals - 08/22/18 1214      PT SHORT TERM GOAL #1   Title  Pt will be independent with HEP in order to improve strength and balance in order to decrease fall risk and improve function at home and work.    Time  8    Period  Weeks    Status  New        PT Long Term Goals - 08/22/18 1215      PT LONG TERM GOAL #1   Title  Pt will decrease worst pain as reported on NPRS by at least 3 points in order to demonstrate clinically significant reduction in pain.    Baseline  08/22/18 4/10    Time  8    Period  Weeks    Status  New      PT LONG TERM GOAL #2   Title  Patient will increase FOTO score to 69 to demonstrate predicted increase in functional mobility to complete ADLs    Baseline  08/22/18 67    Time  8    Period  Weeks    Status  New      PT LONG TERM GOAL #3   Title  Patient will report no pain with sitting 30min in order to complete work duties with precision    Baseline  08/22/18 Sit for 5-10 mins with 4/10 pain and needing to stretch/stand    Time  8    Period  Weeks    Status  New              Patient will benefit from skilled therapeutic intervention in order to improve the following deficits and impairments:     Visit Diagnosis: Pain in left hip     Problem List Patient Active Problem List   Diagnosis Date Noted  . Lumbar spine instability 08/31/2018  . Chronic lumbar radiculitis (S1) (Left) 08/23/2018  . Neurogenic pain 08/23/2018  . DDD (degenerative disc disease), lumbosacral 08/23/2018  . Osteoarthritis of hip (Left) 08/08/2018  . Abnormal MRI, lumbar spine (12/23/2017) 07/24/2018  . Lumbar facet arthropathy 07/24/2018  . Lumbar facet syndrome 07/24/2018  . Grade 1 Anterolisthesis L5/S1 07/24/2018  . Lumbar lateral recess stenosis  07/24/2018  . Lumbar foraminal stenosis 07/24/2018  . Lumbar nerve root impingement (L5) (Left) 07/24/2018  . Lumbar pars defect (L5) (Bilateral) 07/24/2018  . Lumbar L5 pars defect w/ spondylolisthesis 07/24/2018  . Chronic musculoskeletal pain  07/24/2018  . Chronic sacroiliac joint pain [facility] Area of Pain) (Right) 07/24/2018  . Family history of factor V Leiden mutation 06/29/2018  . GAD (generalized anxiety disorder) 06/29/2018  . Lyme disease 06/29/2018  . Major depression 06/29/2018  . Spondylosis 06/29/2018  . Vaginal infection 06/29/2018  . Chronic groin pain (Primary Area of Pain) (Left) 06/29/2018  . Chronic hip pain (Secondary Area of Pain) (Left) 06/29/2018  . Chronic low back pain (Fourth Area of Pain) (Bilateral) (L>R) w/ sciatica (Bilateral) 06/29/2018  . Chronic lower extremity pain (Bilateral) (L>R) 06/29/2018  . Chronic pain syndrome 06/29/2018  . Pharmacologic therapy 06/29/2018  . Disorder of skeletal system 06/29/2018  . Problems influencing health status 06/29/2018  . Endometriosis 01/10/2018  . Meniere's disease of right ear 06/14/2017  . Allergic rhinitis due to animal hair and dander 02/15/2017  . Dysfunction of right eustachian tube 02/15/2017  . Hearing loss, sensorineural, asymmetrical 02/15/2017  . Vestibular dizziness 02/15/2017  . Insomnia 06/17/2014  . Snoring 06/17/2014  . DDD (degenerative disc disease), cervical 05/21/2014  . Migraines 05/21/2014  . Spondylolisthesis at L5-S1 level 05/21/2014   Staci Acosta PT, DPT Staci Acosta 09/05/2018, 12:43 PM  [place] [facility] PHYSICAL AND SPORTS MEDICINE [address], [place], [address] Phone: [phone]   Fax:  [phone]  Name: VERNABELLE KNODEL MRN: [id number] Date of Birth: 11-Jul-1981

## 2018-09-07 ENCOUNTER — Ambulatory Visit: Payer: Managed Care, Other (non HMO) | Admitting: Physical Therapy

## 2018-09-08 ENCOUNTER — Encounter: Payer: Self-pay | Admitting: Pain Medicine

## 2018-09-08 ENCOUNTER — Emergency Department
Admission: EM | Admit: 2018-09-08 | Discharge: 2018-09-08 | Discharge status: Against Medical Advice | Payer: Managed Care, Other (non HMO) | Attending: Emergency Medicine | Admitting: Emergency Medicine

## 2018-09-08 ENCOUNTER — Encounter: Payer: Self-pay | Admitting: Intensive Care

## 2018-09-08 ENCOUNTER — Other Ambulatory Visit: Payer: Self-pay

## 2018-09-08 ENCOUNTER — Telehealth: Payer: Self-pay

## 2018-09-08 DIAGNOSIS — Z5321 Procedure and treatment not carried out due to patient leaving prior to being seen by health care provider: Secondary | ICD-10-CM | POA: Diagnosis not present

## 2018-09-08 DIAGNOSIS — R51 Headache: Secondary | ICD-10-CM | POA: Diagnosis not present

## 2018-09-08 NOTE — Telephone Encounter (Signed)
Spoke with patient and she states that last night she felt like her right thigh had a severe sunburn.  Denies fever, redness at epidural site, progressive weakness, bowel/bladder issues.  Dr Laban Emperor notified.  Instructed patient to go to the ED or call Dr Laban Emperor via operator for any further questions or concerns.

## 2018-09-08 NOTE — ED Triage Notes (Signed)
Patient presents today with headache and right leg thigh pain that also feels like pins and needles. Reports she had a epidural 08/31/18 and believes this is from the epidural. 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

## 2018-09-10 DIAGNOSIS — B029 Zoster without complications: Secondary | ICD-10-CM

## 2018-09-10 HISTORY — DX: Zoster without complications: B02.9

## 2018-09-11 ENCOUNTER — Encounter: Payer: Self-pay | Admitting: Nurse Practitioner

## 2018-09-11 ENCOUNTER — Ambulatory Visit (HOSPITAL_BASED_OUTPATIENT_CLINIC_OR_DEPARTMENT_OTHER): Payer: Managed Care, Other (non HMO) | Admitting: Nurse Practitioner

## 2018-09-11 ENCOUNTER — Ambulatory Visit
Admission: RE | Admit: 2018-09-11 | Discharge: 2018-09-11 | Disposition: A | Discharge status: Home / Self Care | Payer: Managed Care, Other (non HMO) | Source: Ambulatory Visit | Attending: Nurse Practitioner | Admitting: Nurse Practitioner

## 2018-09-11 ENCOUNTER — Encounter: Payer: Self-pay | Admitting: Pain Medicine

## 2018-09-11 VITALS — BP 106/66 | HR 88 | Temp 99.0°F | Resp 16 | Ht 71.0 in | Wt 195.0 lb

## 2018-09-11 DIAGNOSIS — M533 Sacrococcygeal disorders, not elsewhere classified: Secondary | ICD-10-CM | POA: Insufficient documentation

## 2018-09-11 DIAGNOSIS — M479 Spondylosis, unspecified: Secondary | ICD-10-CM | POA: Diagnosis not present

## 2018-09-11 DIAGNOSIS — G894 Chronic pain syndrome: Secondary | ICD-10-CM

## 2018-09-11 DIAGNOSIS — G8929 Other chronic pain: Secondary | ICD-10-CM

## 2018-09-11 DIAGNOSIS — M79604 Pain in right leg: Secondary | ICD-10-CM

## 2018-09-11 MED ORDER — GABAPENTIN 300 MG PO CAPS: 300.0000 mg | ORAL_CAPSULE | Freq: Every day | ORAL | 0 refills | Status: DC

## 2018-09-11 MED ORDER — ORPHENADRINE CITRATE 30 MG/ML IJ SOLN
60.0000 mg | Freq: Once | INTRAMUSCULAR | Status: AC
  Administered 2018-09-11: 60 mg via INTRAMUSCULAR
  Filled 2018-09-11: qty 2

## 2018-09-11 MED ORDER — KETOROLAC TROMETHAMINE 60 MG/2ML IM SOLN
60.0000 mg | Freq: Once | INTRAMUSCULAR | Status: AC
  Administered 2018-09-11: 60 mg via INTRAMUSCULAR
  Filled 2018-09-11: qty 2

## 2018-09-11 NOTE — Progress Notes (Signed)
Patient's Name: Amber Reed  MRN: [id number]  Referring Provider: Windy Canny*  DOB: [birth year]  PCP: Yates Decamp, MD  DOS: 09/11/2018  Note by: Thad Ranger, NP  Service setting: Ambulatory outpatient  Specialty: Interventional Pain Management  Location: [facility] (AMB) Pain Management Facility    Patient type: Established   HPI  Reason for Visit: [name] is a 38 y.o. year old, female patient, who comes today with a chief complaint of Back Pain (lower right side ) Last Appointment: Her last appointment at our practice was on 09/08/2018. I last saw her on 06/29/2018.  Pain Assessment: Today, Amber Reed describes the severity of the Chronic pain as a 7 /10. She indicates the location/referral of the pain to be Back Lower, Right/into groin area like a band. . Onset was: More than a month ago. The quality of pain is described as Discomfort, Sharp, Stabbing, Throbbing(skin feels like a really bad sunburn, is hypersensitive and is uncomfortable for clothes to touch the area. ). Temporal description, or timing of pain is: Constant. Possible modifying factors: gabapentin and laying down at night.  . Amber Reed's  height is  (1.803 m) and weight is 195 lb (88.5 kg). Her oral temperature is 99 F (37.2 C). Her blood pressure is 106/66 and her pulse is 88. Her respiration is 16 and oxygen saturation is 100%.  She is having a lot of pain in her lower back. She is having a band of pain in her right upper thigh. She felt like the burning pain was just a few days ago 09/07/2018. She woke up with the pain. This is one week later her left-sided transforaminal 5 is 1 epidural steroid injection and right L4-5 epidural steroid injection.  He feels like the left side is doing great.  He feels like the right leg nerves are hypersensitive.  The pain comes a goes.  It is worse during the day She admits that the initial contact is bad with sitting down on the commode.The " sand paper" numb  feeling is in the entire thigh.  She does have some back of the calf pain only on the right.. She admits that it feels like if the leg were to be cut off it would feel better. She has not been doing anything differently.  She does continue to work regularly.She admits that she did step down once while at work and and wet her pants.  She admits that this is not usual however she is never done it with just stepping.  It is normally with laughter or increased activity. She is taking the Gabapentin 300 mg Qhs and methocarbamol at night. She denies any other symptoms.   ROS  Constitutional: Denies any fever or chills Gastrointestinal: No reported hemesis, hematochezia, vomiting, or acute GI distress Musculoskeletal: Denies any acute onset joint swelling, redness, loss of ROM, or weakness Neurological: No reported episodes of acute onset apraxia, aphasia, dysarthria, agnosia, amnesia, paralysis, loss of coordination, or loss of consciousness  Medication Review  Betahistine Dihydrochloride, Cyanocobalamin-Methylcobalamin, azelastine, diclofenac, gabapentin, hydrochlorothiazide, meclizine, methocarbamol, ondansetron, potassium chloride SA, and vitamin E  History Review  Allergy: Amber Reed is allergic to other; triptans; and amoxicillin. Drug: Amber Reed  reports previous drug use. Alcohol:  reports current alcohol use. Tobacco:  reports that she has never smoked. She has never used smokeless tobacco. Social: Ms. Epling  reports that she has never smoked. She has never used smokeless tobacco. She reports current alcohol use. She  reports previous drug use. Medical:  has a past medical history of Anxiety, Endometriosis, Factor 5 Leiden mutation, heterozygous (HCC), Meniere disease, Migraine, and PCO (polycystic ovaries). Surgical: Amber Reed  has a past surgical history that includes Cesarean section; Nasal sinus surgery; Pelvic laparoscopy; and Small intestine surgery. Family: family history includes Cancer  in her maternal grandmother; Diabetes in her mother; Seizures in her paternal aunt; Stroke in her paternal grandmother. Problem List: Amber Reed has Chronic groin pain (Primary Area of Pain) (Left); Chronic hip pain (Secondary Area of Pain) (Left); Chronic low back pain (Fourth Area of Pain) (Bilateral) (L>R) w/ sciatica (Bilateral); Chronic lower extremity pain (Bilateral) (L>R); Chronic pain syndrome; and Pharmacologic therapy on their pertinent problem list.  Lab Review  Kidney Function Lab Results  Component Value Date   BUN 13 06/29/2018   CREATININE 0.75 06/29/2018   BCR 17 06/29/2018   GFRAA 118 06/29/2018   GFRNONAA 102 06/29/2018  Liver Function Lab Results  Component Value Date   AST 15 06/29/2018   ALBUMIN 4.8 06/29/2018  Note: Above Lab results reviewed.  Imaging Review  DG C-Arm 1-60 Min-No Report Fluoroscopy was utilized by the requesting physician.  No radiographic  interpretation.  Note: Reviewed        Physical Exam  General appearance: Well nourished, well developed, and well hydrated. In no apparent acute distress Mental status: Alert, oriented x 3 (person, place, & time)       Respiratory: No evidence of acute respiratory distress Eyes: PERLA Vitals: BP 106/66 (BP Location: Right Arm, Patient Position: Sitting, Cuff Size: Large)   Pulse 88   Temp 99 F (37.2 C) (Oral)   Resp 16   Ht 5\' 11"  (1.803 m)   Wt 195 lb (88.5 kg)   SpO2 100%   BMI 27.20 kg/m  BMI: Estimated body mass index is 27.2 kg/m as calculated from the following:   Height as of this encounter: 5\' 11"  (1.803 m).   Weight as of this encounter: 195 lb (88.5 kg). Ideal: Ideal body weight: 70.8 kg (156 lb 1.4 oz) Adjusted ideal body weight: 77.9 kg (171 lb 10.4 oz) Lumbar Spine Area Exam  Skin & Axial Inspection: No masses, redness, or swelling Alignment: Symmetrical Functional ROM: Unrestricted ROM       Stability: No instability detected Muscle Tone/Strength: Functionally intact. No  obvious neuro-muscular anomalies detected. Sensory (Neurological): Unimpaired Palpation: Complains of area being tender to palpation       Provocative Tests: Hyperextension/rotation test: deferred today       Lumbar quadrant test (Kemp's test): deferred today       Lateral bending test: deferred today       Patrick's Maneuver: (+) for right-sided S-I arthralgia              Gait & Posture Assessment  Ambulation: Unassisted Gait: Relatively normal for age and body habitus Posture: WNL  Lower Extremity Exam    Side: Right lower extremity  Side: Left lower extremity  Stability: No instability observed          Stability: No instability observed          Skin & Extremity Inspection: Skin color, temperature, and hair growth are WNL. No peripheral edema or cyanosis. No masses, redness, swelling, asymmetry, or associated skin lesions. No contractures.  Skin & Extremity Inspection: Skin color, temperature, and hair growth are WNL. No peripheral edema or cyanosis. No masses, redness, swelling, asymmetry, or associated skin lesions. No contractures.  Functional ROM:  Unrestricted ROM                  Functional ROM: Unrestricted ROM                  Muscle Tone/Strength: Functionally intact. No obvious neuro-muscular anomalies detected.  Muscle Tone/Strength: Functionally intact. No obvious neuro-muscular anomalies detected.  Sensory (Neurological): Unimpaired        Sensory (Neurological): Unimpaired            Palpation: No palpable anomalies  Palpation: No palpable anomalies   Assessment   Status Diagnosis  Worsening Persistent Worsening 1. Chronic sacroiliac joint pain (Tertiary Area of Pain) (Right)   2. Spondylosis   3. Chronic pain of right lower extremity   4. Chronic pain syndrome      Updated Problems: Problem  Chronic Pain of Right Lower Extremity    Plan of Care  Pharmacotherapy (Medications Ordered): Meds ordered this encounter  Medications  . orphenadrine (NORFLEX)  injection 60 mg  . ketorolac (TORADOL) injection 60 mg  . gabapentin (NEURONTIN) 300 MG capsule    Sig: Take 1 capsule (300 mg total) by mouth at bedtime. Increasing by 1 nightly for 2 weeks then continue at 900mg     Dispense:  90 capsule    Refill:  0    Do not place this medication, or any other prescription from our practice, on "Automatic Refill". Patient may have prescription filled one day early if pharmacy is closed on scheduled refill date.    Order Specific Question:   Supervising Provider    Answer:   Delano Metz [phone]   Administered today: We administered orphenadrine and ketorolac.  Orders:  Orders Placed This Encounter  Procedures  . DG Si Joints    Standing Status:   Future    Number of Occurrences:   1    Standing Expiration Date:   12/12/2018    Order Specific Question:   Reason for Exam (SYMPTOM  OR DIAGNOSIS REQUIRED)    Answer:   Sacroiliac joint pain    Order Specific Question:   Is the patient pregnant?    Answer:   No    Order Specific Question:   Preferred imaging location?    Answer:   [facility]    Order Specific Question:   Call Results- Best Contact Number?    Answer:   [phone] [facility])   Interventional options: Planned follow-up:   I have also instructed her to increase the gabapentin 300mg  2 tablets nightly for 1 week then gabapentin 300 mg 3 tablets nightly for 1 week  plan: Follow-up as previously scheduled    Considering:   Diagnostic left L4-5 interlaminar LESI Diagnostic left L4 transforaminal ESI Diagnosticleft L5-S1LESI Diagnostic bilateral vsleft L5 transforaminal ESI Diagnostic bilateral lumbar facet blocks Possible bilateral lumbar facetRFA Diagnosticleft lateral femoralcutaneousnerve block Diagnostic left sacroiliac joint injection Possible left sacroiliacRFA   Palliative PRN treatment(s):   Diagnostic left intra-articular hip joint injection #2 (some benefit but not significant)     Note by: Thad Ranger, NP Date: 09/11/2018; Time: 3:25 PM

## 2018-09-11 NOTE — Progress Notes (Signed)
Safety precautions to be maintained throughout the outpatient stay will include: orient to surroundings, keep bed in low position, maintain call bell within reach at all times, provide assistance with transfer out of bed and ambulation.  

## 2018-09-11 NOTE — Patient Instructions (Signed)

## 2018-09-12 ENCOUNTER — Ambulatory Visit: Payer: Managed Care, Other (non HMO) | Attending: Pain Medicine

## 2018-09-12 DIAGNOSIS — M25552 Pain in left hip: Secondary | ICD-10-CM | POA: Insufficient documentation

## 2018-09-12 NOTE — Therapy (Signed)
[facility] [address] [place], [address] Phone: [phone]   Fax:  [phone]  Physical Therapy Treatment  Patient Details  Name: Amber Reed MRN: [id number] Date of Birth: September 27, 1980 Referring Provider (PT): Laban Emperor    Encounter Date: 09/12/2018  PT End of Session - 09/12/18 1045    Visit Number  4    Number of Visits  17    Date for PT Re-Evaluation  10/17/18    PT Start Time  1032    PT Stop Time  1112    PT Time Calculation (min)  40 min    Activity Tolerance  Patient tolerated treatment well;Patient limited by pain    Behavior During Therapy  [facility] for tasks assessed/performed       Past Medical History:  Diagnosis Date  . Anxiety   . Endometriosis   . Factor 5 Leiden mutation, heterozygous (HCC)   . Meniere disease   . Migraine   . PCO (polycystic ovaries)     Past Surgical History:  Procedure Laterality Date  . CESAREAN SECTION    . NASAL SINUS SURGERY    . PELVIC LAPAROSCOPY    . SMALL INTESTINE SURGERY      There were no vitals filed for this visit.  Subjective Assessment - 09/12/18 1036    Subjective  Pt reports her LLE symptoms continue to feel good. She has had new RLE paresthesias and hypersensitivity since last week, which are continually aggravated by walking as her pants rub that area of her skin. She says it feels lke a tight band wrapped around her hip.     Pertinent History  Patient is a 38 year old female presenting with L hip pain. Patient reports chronic history with LBP (spondylolesthesis) with L hip pain exacerbated "a couple months ago". Patient reports insideous onset with gradually worsening pain in L hip over the past couple months. Patient reports her pain is in a C shape from groin around to deep buttock that is a deep, annoying/nagging pain. Patient reports she has had burning/tingling sensation down posterior buttock to the bottom of the foot, that "comes and  goes". Patient reports the she is having some "tingling down front of R thigh that is how the LLE pain started". Patient reports she cannot sleep on the L side, or sit for extended periods periods. 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  Patient reports worst pain over past week 4/10 best 2/10.     Currently in Pain?  Yes    Pain Score  5    much higher prior to injections yesterday (norflex and toredol)    Pain Location  --   Right posterior gluteal area and upper thigh.    Pain Orientation  Right;Lower    Pain Descriptors / Indicators  Sharp;Stabbing    Pain Type  Acute pain    Pain Radiating Towards  Rt groin and hip like a tight band         Therapeutic Exercise -STKC stretch 3x30sec (contrlateral glue/quad set for hip flexor stretch) -Belly breathing activation of transverse abdominus 10x exhale x10sec, with abdominal draw-in maneuver  -Draw in + double leg curl-up x15 (HA unaffected) -Deadbug BLE isometric with BUE physioball flexion 1x15  -Deadbug, Legs only 2x10 alternating pattern, VC/tactile cues to limit excursion -Sidelying hipswing(sagital plane) in slight abduction VC/tactile cues for core stabilization -SLS stance c Contrlateral hip flexion 90 degrees to  end range: 1x10 bilat (no UE assist), VC to maintain TKE on stance leg -Glute bridge 2x 10with mod cuing initially to prevent LB ext with maintained core contraction with good carry over following -STS from chair 1x10, shoulder width feet, Tactile cues for knees maintained at foot width with squat depth    PT Short Term Goals - 08/22/18 1214      PT SHORT TERM GOAL #1   Title  Pt will be independent with HEP in order to improve strength and balance in order to decrease fall risk and improve function at home and work.    Time  8    Period  Weeks    Status  New        PT Long Term Goals - 08/22/18 1215      PT LONG TERM GOAL #1   Title  Pt will decrease worst pain as reported on NPRS by at  least 3 points in order to demonstrate clinically significant reduction in pain.    Baseline  08/22/18 4/10    Time  8    Period  Weeks    Status  New      PT LONG TERM GOAL #2   Title  Patient will increase FOTO score to 69 to demonstrate predicted increase in functional mobility to complete ADLs    Baseline  08/22/18 67    Time  8    Period  Weeks    Status  New      PT LONG TERM GOAL #3   Title  Patient will report no pain with sitting in order to complete work duties with precision    Baseline  08/22/18 Sit for 5-10 mins with 4/10 pain and needing to stretch/stand    Time  8    Period  Weeks    Status  New            Plan - 09/12/18 1047    Clinical Impression Statement  Pt reporting to PT with continued neural aggravation on the Right side since the procedure. She also reports some continued HA pain (better when lying flat) and neck stiffness. Continued with gentle lumbard flexion mobility, hip flexor stretching, and core stabilization. HA does acutely worsen with DKTC stretch, which is DC after 2 attempts. LLE symptoms continue to be well controlled after procedure. Pt educated on gentle resensitization techniques on the RLE, however she has remained active and is not adverse to activity at this time.      Rehab Potential  Good    PT Frequency  2x / week    PT Duration  8 weeks    PT Treatment/Interventions  Aquatic Therapy;Electrical Stimulation;Cryotherapy;Moist Heat;Iontophoresis 4mg /ml Dexamethasone;Ultrasound;Traction;Gait training;Stair training;Therapeutic activities;Therapeutic exercise;Neuromuscular re-education;Functional mobility training;Balance training;Patient/family education;Manual techniques;Dry needling;Passive range of motion;Taping;Spinal Manipulations;Joint Manipulations    PT Next Visit Plan  Decrease soft tissue tension and core stability as able    PT Home Exercise Plan  adductor, glute med stretching    Consulted and Agree with Plan of Care   Patient       Patient will benefit from skilled therapeutic intervention in order to improve the following deficits and impairments:  Decreased activity tolerance, Decreased endurance, Decreased range of motion, Increased fascial restricitons, Improper body mechanics, Pain, Decreased mobility, Postural dysfunction, Impaired tone, Impaired flexibility, Increased muscle spasms  Visit Diagnosis: Pain in left hip     Problem List Patient Active Problem List   Diagnosis Date Noted  . Chronic pain of right  lower extremity 09/11/2018  . Lumbar spine instability 08/31/2018  . Chronic lumbar radiculitis (S1) (Left) 08/23/2018  . Neurogenic pain 08/23/2018  . DDD (degenerative disc disease), lumbosacral 08/23/2018  . Osteoarthritis of hip (Left) 08/08/2018  . Abnormal MRI, lumbar spine (12/23/2017) 07/24/2018  . Lumbar facet arthropathy 07/24/2018  . Lumbar facet syndrome 07/24/2018  . Grade 1 Anterolisthesis L5/S1 07/24/2018  . Lumbar lateral recess stenosis 07/24/2018  . Lumbar foraminal stenosis 07/24/2018  . Lumbar nerve root impingement (L5) (Left) 07/24/2018  . Lumbar pars defect (L5) (Bilateral) 07/24/2018  . Lumbar L5 pars defect w/ spondylolisthesis 07/24/2018  . Chronic musculoskeletal pain 07/24/2018  . Chronic sacroiliac joint pain [facility](Tertiary Area of Pain) (Right) 07/24/2018  . Family history of factor V Leiden mutation 06/29/2018  . GAD (generalized anxiety disorder) 06/29/2018  . Lyme disease 06/29/2018  . Major depression 06/29/2018  . Spondylosis 06/29/2018  . Vaginal infection 06/29/2018  . Chronic groin pain (Primary Area of Pain) (Left) 06/29/2018  . Chronic hip pain (Secondary Area of Pain) (Left) 06/29/2018  . Chronic low back pain (Fourth Area of Pain) (Bilateral) (L>R) w/ sciatica (Bilateral) 06/29/2018  . Chronic lower extremity pain (Bilateral) (L>R) 06/29/2018  . Chronic pain syndrome 06/29/2018  . Pharmacologic therapy 06/29/2018  . Disorder of skeletal  system 06/29/2018  . Problems influencing health status 06/29/2018  . Endometriosis 01/10/2018  . Meniere's disease of right ear 06/14/2017  . Allergic rhinitis due to animal hair and dander 02/15/2017  . Dysfunction of right eustachian tube 02/15/2017  . Hearing loss, sensorineural, asymmetrical 02/15/2017  . Vestibular dizziness 02/15/2017  . Insomnia 06/17/2014  . Snoring 06/17/2014  . DDD (degenerative disc disease), cervical 05/21/2014  . Migraines 05/21/2014  . Spondylolisthesis at L5-S1 level 05/21/2014   11:20 AM, 09/12/18 Rosamaria LintsAllan C Krishawn Vanderweele, PT, DPT Physical Therapist - [place] [phone] (Office)   Yvonne Stopher C 09/12/2018, 11:01 AM  [facility] [address] , [place], [id number] Phone: [phone]   Fax:  [phone]  Name: Amber Reed MRN: [id number] Date of Birth: [birth year]

## 2018-09-13 ENCOUNTER — Ambulatory Visit: Payer: Managed Care, Other (non HMO) | Admitting: Pain Medicine

## 2018-09-13 ENCOUNTER — Encounter: Payer: Self-pay | Admitting: Pain Medicine

## 2018-09-14 ENCOUNTER — Telehealth: Payer: Self-pay | Admitting: Physical Therapy

## 2018-09-14 ENCOUNTER — Ambulatory Visit: Payer: Managed Care, Other (non HMO) | Admitting: Physical Therapy

## 2018-09-14 NOTE — Telephone Encounter (Signed)
Called pt back to follow up with her about attendance with shingles diagnosis. Explained to her that she is able to come in as long as she is taking the medication as her dr prescribed, has the area covered, and the rash is not in the region the therapist needs to perform manual therapy. Also reviewed general requirements to be fever and vomiting free for 24 hours unmedicated prior to coming in. She had not had any of those symptoms. States her medical providers thought the stiff neck and headache was related to shingles. Confirmed her next treatment session time and date with her. She said she understood and plans to be there.

## 2018-09-14 NOTE — Telephone Encounter (Signed)
Called pt when she did not show up for her appt today. No answer. Left message notifying her of the missed visit and her next upcoming visit Tues 3/10 at 10:30 with chelsea. Requested a call back at [phone] to get an update on how she is doing.

## 2018-09-17 NOTE — Progress Notes (Signed)
Patient's Name: Amber Reed  MRN: [id number]  Referring Provider: Girard Reed*  DOB: October 19, 1980  PCP: Amber Most, MD  DOS: 09/20/2018  Note by: Amber Cola, MD  Service setting: Ambulatory outpatient  Specialty: Interventional Pain Management  Location: [facility] (AMB) Pain Management Facility    Patient type: Established   Primary Reason(s) for Visit: Encounter for post-procedure evaluation of chronic illness with mild to moderate exacerbation CC: Leg Pain  HPI  Amber Reed is a 38 y.o. year old, female patient, who comes today for a post-procedure evaluation. She has Endometriosis; Allergic rhinitis due to animal hair and dander; DDD (degenerative disc disease), cervical; Dysfunction of right eustachian tube; Family history of factor V Leiden mutation; GAD (generalized anxiety disorder); Hearing loss, sensorineural, asymmetrical; Insomnia; Lyme disease; Major depression; Meniere's disease of right ear; Migraines; Snoring; Spondylolisthesis at L5-S1 level; Spondylosis; Vaginal infection; Vestibular dizziness; Chronic groin pain (Primary Area of Pain) (Left); Chronic hip pain (Secondary Area of Pain) (Left); Chronic low back pain (Fourth Area of Pain) (Bilateral) (L>R) w/ sciatica (Bilateral); Chronic lower extremity pain (Bilateral) (L>R); Chronic pain syndrome; Pharmacologic therapy; Disorder of skeletal system; Problems influencing health status; Abnormal MRI, lumbar spine (12/23/2017); Lumbar facet arthropathy; Lumbar facet syndrome; Grade 1 Anterolisthesis L5/S1; Lumbar lateral recess stenosis; Lumbar foraminal stenosis; Lumbar nerve root impingement (L5) (Left); Lumbar pars defect (L5) (Bilateral); Lumbar L5 pars defect w/ spondylolisthesis; Chronic musculoskeletal pain; Chronic sacroiliac joint pain (Tertiary Area of Pain) (Right); Osteoarthritis of hip (Left); Chronic lumbar radiculitis (S1) (Left); Neurogenic pain; DDD (degenerative disc disease), lumbosacral; Lumbar  spine instability; Chronic pain of right lower extremity; Shingles, Lumbar (Right); Acute pain of right lower extremity; and Acute lumbar radiculopathy (Right) (L2) on their problem list. Her primarily concern today is the Leg Pain  Pain Assessment: Location: Right (right posterior gluteal and upper thigh) Radiating: pain radiaties down thigh Onset: More than a month ago Duration: Acute pain Quality: Sharp, Burning, Penetrating Severity: 5 /10 (subjective, self-reported pain score)  Note: Reported level is inconsistent with clinical observations. Clinically the patient looks like a 3/10 A 3/10 is viewed as "Moderate" and described as significantly interfering with activities of daily living (ADL). It becomes difficult to feed, bathe, get dressed, get on and off the toilet or to perform personal hygiene functions. Difficult to get in and out of bed or a chair without assistance. Very distracting. With effort, it can be ignored when deeply involved in activities. Amber Reed continues to use a standard subjective pain scale, rather than an objective pain scale as instructed. When using our objective Pain Scale, levels between 6 and 10/10 are said to belong in an emergency room, as it progressively worsens from a 6/10, described as severely limiting, requiring emergency care not usually available at an outpatient pain management facility. At a 6/10 level, communication becomes difficult and requires great effort. Assistance to reach the emergency department may be required. Facial flushing and profuse sweating along with potentially dangerous increases in heart rate and blood pressure will be evident. Effect on ADL: slow me down Timing: Constant Modifying factors: medications BP: 133/90  HR: (!) 109  [name] comes in today for post-procedure evaluation.  Further details on both, my assessment(s), as well as the proposed treatment plan, please see below.  Post-Procedure Assessment  08/31/2018  Procedure: Diagnostic left-sided L5 + S1 transforaminal ESI #1+ diagnostic right-sided L4-5 LESI #1 under fluoroscopic guidance and IV sedation Pre-procedure pain score:  3/10 Post-procedure pain score: 0/10 (100% relief)  Influential Factors: BMI: 27.20 kg/m Intra-procedural challenges: None observed.         Assessment challenges: None detected.              Reported side-effects: Positional headache        Post-procedural adverse reactions or complications: Post dural puncture headache In addition, the patient appears to have developed an L2 radiculopathy secondary to shingles (herpes zoster infection.  Sedation: Sedation provided. When no sedatives are used, the analgesic levels obtained are directly associated to the effectiveness of the local anesthetics. However, when sedation is provided, the level of analgesia obtained during the initial 1 hour following the intervention, is believed to be the result of a combination of factors. These factors may include, but are not limited to: 1. The effectiveness of the local anesthetics used. 2. The effects of the analgesic(s) and/or anxiolytic(s) used. 3. The degree of discomfort experienced by the patient at the time of the procedure. 4. The patients ability and reliability in recalling and recording the events. 5. The presence and influence of possible secondary gains and/or psychosocial factors. Reported result: Relief experienced during the 1st hour after the procedure: 100 % (Ultra-Short Term Relief) Amber Reed has indicated area to have been numb during this time. Interpretative annotation: Clinically appropriate result. Analgesia during this period is likely to be Local Anesthetic and/or IV Sedative (Analgesic/Anxiolytic) related.          Effects of local anesthetic: The analgesic effects attained during this period are directly associated to the localized infiltration of local anesthetics and therefore cary significant diagnostic value as to  the etiological location, or anatomical origin, of the pain. Expected duration of relief is directly dependent on the pharmacodynamics of the local anesthetic used. Long-acting (4-6 hours) anesthetics used.  Reported result: Relief during the next 4 to 6 hour after the procedure: 100 % (Short-Term Relief) Amber Reed has indicated area to have been numb during this time. Interpretative annotation: Clinically appropriate result. Analgesia during this period is likely to be Local Anesthetic-related.          Long-term benefit: Defined as the period of time past the expected duration of local anesthetics (1 hour for short-acting and 4-6 hours for long-acting). With the possible exception of prolonged sympathetic blockade from the local anesthetics, benefits during this period are typically attributed to, or associated with, other factors such as analgesic sensory neuropraxia, antiinflammatory effects, or beneficial biochemical changes provided by agents other than the local anesthetics.  Reported result: Extended relief following procedure: 100 %(on going) (Long-Term Relief)            Interpretative annotation: Clinically possible results. Good relief. Therapeutic success. Significant inflammatory component detected. Benefit believed to be steroid-related.  Current benefits: Defined as reported results that persistent at this point in time.   Analgesia: 75-100 % Amber Reed reports improvement of axial and extremity symptoms.  At this point she has complete resolution of her left lower extremity symptoms.  However, it turns out that she has developed a right sided L2 radiculopathy secondary to a herpes zoster infection (shingles).  She has already had her antivirals but she continues to have allodynia in the area and therefore we will bring her back for a lumbar epidural steroid injection. Function: Amber Reed reports improvement in function ROM: Amber Reed reports improvement in ROM Interpretative annotation:  Complete relief. Therapeutic success. Effective therapeutic approach. Benefit could be steroid-related.  Interpretation: Results would suggest a successful diagnostic intervention.  Plan:  At this point, we will turn our attention to the patient's right-sided L2 radiculopathy secondary to shingles.  We will schedule her to come back for an epidural steroid injection.                Laboratory Chemistry  Inflammation Markers (CRP: Acute Phase) (ESR: Chronic Phase) Lab Results  Component Value Date   CRP 7 06/29/2018   ESRSEDRATE 15 06/29/2018                         Renal Markers Lab Results  Component Value Date   BUN 13 06/29/2018   CREATININE 0.75 06/29/2018   BCR 17 06/29/2018   GFRAA 118 06/29/2018   GFRNONAA 102 06/29/2018                             Hepatic Markers Lab Results  Component Value Date   AST 15 06/29/2018   ALBUMIN 4.8 06/29/2018                        Note: Lab results reviewed.  Recent Imaging Results   Results for orders placed in visit on 08/31/18  DG C-Arm 1-60 Min-No Report   Narrative Fluoroscopy was utilized by the requesting physician.  No radiographic  interpretation.    Interpretation Report: Fluoroscopy was used during the procedure to assist with needle guidance. The images were interpreted intraoperatively by the requesting physician.        Meds   Current Outpatient Medications:  .  Betahistine HCl (BETAHISTINE DIHYDROCHLORIDE) POWD, Use 24 mg once daily, Disp: , Rfl:  .  Cyanocobalamin-Methylcobalamin 600-600 MCG SUBL, Place under the tongue., Disp: , Rfl:  .  diclofenac (CATAFLAM) 50 MG tablet, diclofenac potassium 50 mg tablet  TAKE 1 TABLET BY MOUTH EVERY 6 HOURS as needed, Disp: , Rfl:  .  gabapentin (NEURONTIN) 300 MG capsule, Take 1 capsule (300 mg total) by mouth at bedtime. Increasing by 1 nightly for 2 weeks then continue at 920m, Disp: 90 capsule, Rfl: 0 .  hydrochlorothiazide (HYDRODIURIL) 12.5 MG tablet,  Take 12.5 mg by mouth daily., Disp: , Rfl:  .  hydrochlorothiazide (HYDRODIURIL) 25 MG tablet, Take 25 mg by mouth daily., Disp: , Rfl:  .  meclizine (ANTIVERT) 25 MG tablet, Take 1 tablet by mouth as needed., Disp: , Rfl:  .  methocarbamol (ROBAXIN) 500 MG tablet, Take 1 tablet (500 mg total) by mouth at bedtime., Disp: 30 tablet, Rfl: 2 .  ondansetron (ZOFRAN-ODT) 8 MG disintegrating tablet, , Disp: , Rfl:  .  potassium chloride SA (K-DUR,KLOR-CON) 20 MEQ tablet, Take 20 mEq by mouth daily., Disp: , Rfl:  .  vitamin E 600 UNIT capsule, Take by mouth., Disp: , Rfl:  .  azelastine (ASTELIN) 0.1 % nasal spray, Place into the nose., Disp: , Rfl:   ROS  Constitutional: Denies any fever or chills Gastrointestinal: No reported hemesis, hematochezia, vomiting, or acute GI distress Musculoskeletal: Denies any acute onset joint swelling, redness, loss of ROM, or weakness Neurological: No reported episodes of acute onset apraxia, aphasia, dysarthria, agnosia, amnesia, paralysis, loss of coordination, or loss of consciousness  Allergies  Amber Reed allergic to other; triptans; and amoxicillin.  [place] Drug: Amber Reed reports previous drug use. Alcohol:  reports current alcohol use. Tobacco:  reports that she has never smoked. She has never used smokeless tobacco.  Medical:  has a past medical history of Anxiety, Endometriosis, Factor 5 Leiden mutation, heterozygous ([place]), Meniere disease, Migraine, and PCO (polycystic ovaries). Surgical: Amber Reed  has a past surgical history that includes Cesarean section; Nasal sinus surgery; Pelvic laparoscopy; and Small intestine surgery. Family: family history includes Cancer in her maternal grandmother; Diabetes in her mother; Seizures in her paternal aunt; Stroke in her paternal grandmother.  Constitutional Exam  General appearance: Well nourished, well developed, and well hydrated. In no apparent acute distress Vitals:   09/20/18 0942  BP: 133/90   Pulse: (!) 109  Temp: 98.3 F (36.8 C)  SpO2: 100%  Weight: 195 lb (88.5 kg)  Height: '5\' 11"'  (1.803 m)   BMI Assessment: Estimated body mass index is 27.2 kg/m as calculated from the following:   Height as of this encounter: '5\' 11"'  (1.803 m).   Weight as of this encounter: 195 lb (88.5 kg).  BMI interpretation table: BMI level Category Range association with higher incidence of chronic pain  <18 kg/m2 Underweight   18.5-24.9 kg/m2 Ideal body weight   25-29.9 kg/m2 Overweight Increased incidence by 20%  30-34.9 kg/m2 Obese (Class I) Increased incidence by 68%  35-39.9 kg/m2 Severe obesity (Class II) Increased incidence by 136%  >40 kg/m2 Extreme obesity (Class III) Increased incidence by 254%   Patient's current BMI Ideal Body weight  Body mass index is 27.2 kg/m. Ideal body weight: 70.8 kg (156 lb 1.4 oz) Adjusted ideal body weight: 77.9 kg (171 lb 10.4 oz)   BMI Readings from Last 4 Encounters:  09/20/18 27.20 kg/m  09/11/18 27.20 kg/m  09/08/18 27.20 kg/m  08/31/18 27.20 kg/m   Wt Readings from Last 4 Encounters:  09/20/18 195 lb (88.5 kg)  09/11/18 195 lb (88.5 kg)  09/08/18 195 lb (88.5 kg)  08/31/18 195 lb (88.5 kg)  Psych/Mental status: Alert, oriented x 3 (person, place, & time)       Eyes: PERLA Respiratory: No evidence of acute respiratory distress  Cervical Spine Area Exam  Skin & Axial Inspection: No masses, redness, edema, swelling, or associated skin lesions Alignment: Symmetrical Functional ROM: Unrestricted ROM      Stability: No instability detected Muscle Tone/Strength: Functionally intact. No obvious neuro-muscular anomalies detected. Sensory (Neurological): Unimpaired Palpation: No palpable anomalies              Upper Extremity (UE) Exam    Side: Right upper extremity  Side: Left upper extremity  Skin & Extremity Inspection: Skin color, temperature, and hair growth are WNL. No peripheral edema or cyanosis. No masses, redness, swelling,  asymmetry, or associated skin lesions. No contractures.  Skin & Extremity Inspection: Skin color, temperature, and hair growth are WNL. No peripheral edema or cyanosis. No masses, redness, swelling, asymmetry, or associated skin lesions. No contractures.  Functional ROM: Unrestricted ROM          Functional ROM: Unrestricted ROM          Muscle Tone/Strength: Functionally intact. No obvious neuro-muscular anomalies detected.  Muscle Tone/Strength: Functionally intact. No obvious neuro-muscular anomalies detected.  Sensory (Neurological): Unimpaired          Sensory (Neurological): Unimpaired          Palpation: No palpable anomalies              Palpation: No palpable anomalies              Provocative Test(s):  Phalen's test: deferred Tinel's test: deferred Apley's scratch test (touch opposite shoulder):  Action 1 (Across chest): deferred Action 2 (Overhead): deferred Action 3 (LB reach): deferred   Provocative Test(s):  Phalen's test: deferred Tinel's test: deferred Apley's scratch test (touch opposite shoulder):  Action 1 (Across chest): deferred Action 2 (Overhead): deferred Action 3 (LB reach): deferred    Thoracic Spine Area Exam  Skin & Axial Inspection: No masses, redness, or swelling Alignment: Symmetrical Functional ROM: Unrestricted ROM Stability: No instability detected Muscle Tone/Strength: Functionally intact. No obvious neuro-muscular anomalies detected. Sensory (Neurological): Unimpaired Muscle strength & Tone: No palpable anomalies  Lumbar Spine Area Exam  Skin & Axial Inspection: No masses, redness, or swelling Alignment: Symmetrical Functional ROM: Unrestricted ROM       Stability: No instability detected Muscle Tone/Strength: Functionally intact. No obvious neuro-muscular anomalies detected. Sensory (Neurological): Unimpaired Palpation: No palpable anomalies       Provocative Tests: Hyperextension/rotation test: deferred today       Lumbar quadrant test  (Kemp's test): deferred today       Lateral bending test: deferred today       Patrick's Maneuver: deferred today                   FABER* test: deferred today                   S-I anterior distraction/compression test: deferred today         S-I lateral compression test: deferred today         S-I Thigh-thrust test: deferred today         S-I Gaenslen's test: deferred today         *(Flexion, ABduction and External Rotation)  Gait & Posture Assessment  Ambulation: Unassisted Gait: Relatively normal for age and body habitus Posture: WNL   Lower Extremity Exam    Side: Right lower extremity  Side: Left lower extremity  Stability: No instability observed          Stability: No instability observed          Skin & Extremity Inspection: Dermatomal rash consistent with a right sided L2 acute episode of shingles.  Skin & Extremity Inspection: Skin color, temperature, and hair growth are WNL. No peripheral edema or cyanosis. No masses, redness, swelling, asymmetry, or associated skin lesions. No contractures.  Functional ROM: Guarding                  Functional ROM: Unrestricted ROM                  Muscle Tone/Strength: Functionally intact. No obvious neuro-muscular anomalies detected.  Muscle Tone/Strength: Functionally intact. No obvious neuro-muscular anomalies detected.  Sensory (Neurological): Allodynia (Painful response to non-painful stimuli) over the L2 dermatomal distribution.        Sensory (Neurological): Unimpaired        DTR: Patellar: deferred today Achilles: deferred today Plantar: deferred today  DTR: Patellar: deferred today Achilles: deferred today Plantar: deferred today  Palpation: No palpable anomalies  Palpation: No palpable anomalies   Assessment   Status Diagnosis  Improving Unimproved Unimproved 1. Herpes zoster without complication   2. Acute pain of right lower extremity   3. Acute lumbar radiculopathy (Right) (L2)   4. DDD (degenerative disc disease),  lumbosacral   5. Lumbar spine instability      Updated Problems: Problem  Shingles, Lumbar (Right)  Acute Pain of Right Lower Extremity  Acute lumbar radiculopathy (Right) (L2)  Chronic Pain of Right Lower Extremity    Plan of  Care  Pharmacotherapy (Medications Ordered): No orders of the defined types were placed in this encounter.  Medications administered today: Amber Reed had no medications administered during this visit.  Orders:  Orders Placed This Encounter  Procedures  . Lumbar Epidural Injection    Standing Status:   Future    Standing Expiration Date:   10/21/2018    Scheduling Instructions:     Procedure: Interlaminar Lumbar Epidural Steroid injection (LESI)  L2-3     Laterality: Right-sided     Sedation: With Sedation.     Timeframe: ASAP    Order Specific Question:   Where will this procedure be performed?    Answer:   [facility]  . Lumbar Epidural Injection    Standing Status:   Future    Standing Expiration Date:   10/21/2018    Scheduling Instructions:     Procedure: Interlaminar Lumbar Epidural Steroid injection (LESI)  L2-3     Laterality: Right-sided     Sedation: With Sedation.     Timeframe: ASAA    Order Specific Question:   Where will this procedure be performed?    Answer:   [facility]  . Ambulatory referral to [facility]    Referral Priority:   Routine    Referral Type:   Consultation    Referral Reason:   Specialty Services Required    Requested Specialty:   Family Medicine    Number of Visits Requested:   1   Lab Orders  No laboratory test(s) ordered today   Imaging Orders  No imaging studies ordered today    Referral Orders     Ambulatory referral to [facility] Planned follow-up:   Return for Procedure (w/ sedation): (R) L2-3 LESI #1. Diagnostic left-sided L5 + S1 transforaminal ESI #2+ diagnostic right-sided L4-5 LESI #2 under fluoroscopic guidance and IV sedation   Interventional management  options: Considering:   Diagnostic left L4-5 interlaminar LESI Diagnostic left L4 transforaminal ESI Diagnosticleft L5-S1LESI Diagnostic bilateral vsleft L5 transforaminal ESI Diagnostic bilateral lumbar facet blocks Possible bilateral lumbar facetRFA Diagnosticleft lateral femoralcutaneousnerve block Diagnostic left sacroiliac joint injection Possible left sacroiliacRFA   Palliative PRN treatment(s):   Diagnostic left intra-articular hip joint injection #2 (100/100/25)   Future Appointments  Date Time Provider [place]  09/21/2018 11:15 AM Shelton Silvas, PT [facility] None  09/26/2018 11:15 AM Shelton Silvas, PT [facility] None  09/28/2018 11:15 AM Shelton Silvas, PT [facility] None  10/03/2018 11:15 AM Shelton Silvas, PT [facility] None  10/05/2018 11:15 AM Shelton Silvas, PT [facility] None  10/10/2018 11:15 AM Shelton Silvas, PT [facility] None  10/12/2018 11:15 AM Shelton Silvas, PT [facility] None   Primary Care Physician: Amber Most, MD Location: [facility] Note by: Amber Cola, MD Date: 09/20/2018; Time: 12:32 PM

## 2018-09-19 ENCOUNTER — Ambulatory Visit: Payer: Managed Care, Other (non HMO) | Admitting: Physical Therapy

## 2018-09-20 ENCOUNTER — Other Ambulatory Visit: Payer: Self-pay

## 2018-09-20 ENCOUNTER — Ambulatory Visit: Payer: Managed Care, Other (non HMO) | Attending: Pain Medicine | Admitting: Pain Medicine

## 2018-09-20 ENCOUNTER — Encounter: Payer: Self-pay | Admitting: Pain Medicine

## 2018-09-20 VITALS — BP 133/90 | HR 109 | Temp 98.3°F | Ht 71.0 in | Wt 195.0 lb

## 2018-09-20 DIAGNOSIS — M5137 Other intervertebral disc degeneration, lumbosacral region: Secondary | ICD-10-CM | POA: Diagnosis not present

## 2018-09-20 DIAGNOSIS — M5416 Radiculopathy, lumbar region: Secondary | ICD-10-CM | POA: Diagnosis not present

## 2018-09-20 DIAGNOSIS — M532X6 Spinal instabilities, lumbar region: Secondary | ICD-10-CM | POA: Diagnosis present

## 2018-09-20 DIAGNOSIS — M79604 Pain in right leg: Secondary | ICD-10-CM | POA: Insufficient documentation

## 2018-09-20 DIAGNOSIS — B029 Zoster without complications: Secondary | ICD-10-CM | POA: Diagnosis not present

## 2018-09-20 NOTE — Patient Instructions (Signed)

## 2018-09-21 ENCOUNTER — Ambulatory Visit: Payer: Managed Care, Other (non HMO) | Admitting: Physical Therapy

## 2018-09-21 ENCOUNTER — Encounter: Payer: Self-pay | Admitting: Physical Therapy

## 2018-09-21 DIAGNOSIS — M25552 Pain in left hip: Secondary | ICD-10-CM | POA: Diagnosis not present

## 2018-09-21 NOTE — Therapy (Signed)
[facility] [address], [place], [address] Phone: [phone]   Fax:  [phone]  Physical Therapy Treatment  Patient Details  Name: Amber Reed MRN: [id number] Date of Birth: 08-20-1980 Referring Provider (PT): Laban Emperor    Encounter Date: 09/21/2018  PT End of Session - 09/21/18 1202    Visit Number  5    Number of Visits  17    Date for PT Re-Evaluation  10/17/18    PT Start Time  1115    PT Stop Time  1200    PT Time Calculation (min)  45 min    Activity Tolerance  Patient tolerated treatment well;Patient limited by pain    Behavior During Therapy  [facility] for tasks assessed/performed       Past Medical History:  Diagnosis Date  . Anxiety   . Endometriosis   . Factor 5 Leiden mutation, heterozygous (HCC)   . Meniere disease   . Migraine   . PCO (polycystic ovaries)     Past Surgical History:  Procedure Laterality Date  . CESAREAN SECTION    . NASAL SINUS SURGERY    . PELVIC LAPAROSCOPY    . SMALL INTESTINE SURGERY      There were no vitals filed for this visit.  Subjective Assessment - 09/21/18 1121    Subjective  Patient reports he LLE symptoms along L2 dermatome still have raw, sunburn sensation following shingles outbreak along this dermatome. Patient reports this pain is very intense, and she has also been having headache that has started getting better after Tuesday.     Pertinent History  Patient is a 38 year old female presenting with L hip pain. Patient reports chronic history with LBP (spondylolesthesis) with L hip pain exacerbated "a couple months ago". Patient reports insideous onset with gradually worsening pain in L hip over the past couple months. Patient reports her pain is in a C shape from groin around to deep buttock that is a deep, annoying/nagging pain. Patient reports she has had burning/tingling sensation down posterior buttock to the bottom of the foot, that "comes and  goes". Patient reports the she is having some "tingling down front of R thigh that is how the LLE pain started". Patient reports she cannot sleep on the L side, or sit for extended periods periods. 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  Patient reports worst pain over past week 4/10 best 2/10.     Limitations  Sitting;House hold activities    How long can you sit comfortably?  5-10 min    How long can you stand comfortably?  10-55mins    How long can you walk comfortably?  unlimited; feels better    Pain Onset  More than a month ago        Ther-Ex - SL Bridge 3x 10 each LE with cuing initially for full hip ext and maintained core contraction with good carry over following - Palloff press 5# 3x 10 with demo and cuing initially for posture and eccentric control with good carry over following - MATRIX hip abd 40# 3x 10  - TRX SL squat 3x 8 each side with demo and min cuing initially for proper hip/knee/ankle alignment with good carry over following - TA push down with maintained posterior pelvic tilt with good carry over following demo and cuing to find proper posture with pelvic tilt  PT Education - 09/21/18 1202    Education Details  Exercise form    Person(s) Educated  Patient    Methods  Explanation;Demonstration;Verbal cues    Comprehension  Verbalized understanding;Returned demonstration;Verbal cues required       PT Short Term Goals - 08/22/18 1214      PT SHORT TERM GOAL #1   Title  Pt will be independent with HEP in order to improve strength and balance in order to decrease fall risk and improve function at home and work.    Time  8    Period  Weeks    Status  New        PT Long Term Goals - 08/22/18 1215      PT LONG TERM GOAL #1   Title  Pt will decrease worst pain as reported on NPRS by at least 3 points in order to demonstrate clinically significant reduction in pain.    Baseline  08/22/18 4/10     Time  8    Period  Weeks    Status  New      PT LONG TERM GOAL #2   Title  Patient will increase FOTO score to 69 to demonstrate predicted increase in functional mobility to complete ADLs    Baseline  08/22/18 67    Time  8    Period  Weeks    Status  New      PT LONG TERM GOAL #3   Title  Patient will report no pain with sitting in order to complete work duties with precision    Baseline  08/22/18 Sit for 5-10 mins with 4/10 pain and needing to stretch/stand    Time  8    Period  Weeks    Status  New            Plan - 09/21/18 1303    Clinical Impression Statement  PT held off manual techniques to prevent from skin irritation per patients request. PT continued to work on core and hip stabilization with progression, which patient is able to complete with accuracy following some cuing. Patient reports no increased pain, but good muscle fatigue. PT will continue to progress core stabilization as able and utilize pain managment techniques as able.     Rehab Potential  Good    Clinical Impairments Affecting Rehab Potential  (+) age, lack of other comorbidities (-) sedentary lifestyle, chronicity of pain, multiple pain sites    PT Frequency  2x / week    PT Duration  8 weeks    PT Treatment/Interventions  Aquatic Therapy;Electrical Stimulation;Cryotherapy;Moist Heat;Iontophoresis 4mg /ml Dexamethasone;Ultrasound;Traction;Gait training;Stair training;Therapeutic activities;Therapeutic exercise;Neuromuscular re-education;Functional mobility training;Balance training;Patient/family education;Manual techniques;Dry needling;Passive range of motion;Taping;Spinal Manipulations;Joint Manipulations    PT Next Visit Plan  Decrease soft tissue tension and core stability as able    PT Home Exercise Plan  adductor, glute med stretching    Consulted and Agree with Plan of Care  Patient       Patient will benefit from skilled therapeutic intervention in order to improve the following deficits  and impairments:  Decreased activity tolerance, Decreased endurance, Decreased range of motion, Increased fascial restricitons, Improper body mechanics, Pain, Decreased mobility, Postural dysfunction, Impaired tone, Impaired flexibility, Increased muscle spasms  Visit Diagnosis: Pain in left hip     Problem List Patient Active Problem List   Diagnosis Date Noted  . Shingles, Lumbar (Right) 09/20/2018  . Acute pain of right lower extremity 09/20/2018  . Acute lumbar radiculopathy (Right) (  L2) 09/20/2018  . Chronic pain of right lower extremity 09/11/2018  . Lumbar spine instability 08/31/2018  . Chronic lumbar radiculitis (S1) (Left) 08/23/2018  . Neurogenic pain 08/23/2018  . DDD (degenerative disc disease), lumbosacral 08/23/2018  . Osteoarthritis of hip (Left) 08/08/2018  . Abnormal MRI, lumbar spine (12/23/2017) 07/24/2018  . Lumbar facet arthropathy 07/24/2018  . Lumbar facet syndrome 07/24/2018  . Grade 1 Anterolisthesis L5/S1 07/24/2018  . Lumbar lateral recess stenosis 07/24/2018  . Lumbar foraminal stenosis 07/24/2018  . Lumbar nerve root impingement (L5) (Left) 07/24/2018  . Lumbar pars defect (L5) (Bilateral) 07/24/2018  . Lumbar L5 pars defect w/ spondylolisthesis 07/24/2018  . Chronic musculoskeletal pain 07/24/2018  . Chronic sacroiliac joint pain [facility] Area of Pain) (Right) 07/24/2018  . Family history of factor V Leiden mutation 06/29/2018  . GAD (generalized anxiety disorder) 06/29/2018  . Lyme disease 06/29/2018  . Major depression 06/29/2018  . Spondylosis 06/29/2018  . Vaginal infection 06/29/2018  . Chronic groin pain (Primary Area of Pain) (Left) 06/29/2018  . Chronic hip pain (Secondary Area of Pain) (Left) 06/29/2018  . Chronic low back pain (Fourth Area of Pain) (Bilateral) (L>R) w/ sciatica (Bilateral) 06/29/2018  . Chronic lower extremity pain (Bilateral) (L>R) 06/29/2018  . Chronic pain syndrome 06/29/2018  . Pharmacologic therapy 06/29/2018  .  Disorder of skeletal system 06/29/2018  . Problems influencing health status 06/29/2018  . Endometriosis 01/10/2018  . Meniere's disease of right ear 06/14/2017  . Allergic rhinitis due to animal hair and dander 02/15/2017  . Dysfunction of right eustachian tube 02/15/2017  . Hearing loss, sensorineural, asymmetrical 02/15/2017  . Vestibular dizziness 02/15/2017  . Insomnia 06/17/2014  . Snoring 06/17/2014  . DDD (degenerative disc disease), cervical 05/21/2014  . Migraines 05/21/2014  . Spondylolisthesis at L5-S1 level 05/21/2014   Staci Acosta PT, DPT Staci Acosta 09/21/2018, 1:05 PM  [facility] PHYSICAL AND SPORTS MEDICINE [address], [place], [address] Phone: [phone]   Fax:  [phone]  Name: Amber Reed MRN: [id number] Date of Birth: [birth year]

## 2018-09-26 ENCOUNTER — Encounter: Payer: Self-pay | Admitting: Physical Therapy

## 2018-09-26 ENCOUNTER — Other Ambulatory Visit: Payer: Self-pay

## 2018-09-26 ENCOUNTER — Ambulatory Visit: Payer: Managed Care, Other (non HMO) | Admitting: Physical Therapy

## 2018-09-26 DIAGNOSIS — M25552 Pain in left hip: Secondary | ICD-10-CM | POA: Diagnosis not present

## 2018-09-26 NOTE — Therapy (Signed)
[facility] PH[facility]ORTS [facility]ICINE [address] [place], [address] Phone: [phone]   Fax:  [phone]  Physical Therapy Treatment  Patient Details  Name: Amber Reed MRN: [id number] Date of Birth: Nov 12, 1980 Referring Provider (PT): Laban Emperor    Encounter Date: 09/26/2018  PT End of Session - 09/26/18 1202    Visit Number  6    Number of Visits  17    Date for PT Re-Evaluation  10/17/18    PT Start Time  1120    PT Stop Time  1205    PT Time Calculation (min)  45 min    Activity Tolerance  Patient tolerated treatment well;Patient limited by pain    Behavior During Therapy  [facility] for tasks assessed/performed       Past Medical History:  Diagnosis Date  . Anxiety   . Endometriosis   . Factor 5 Leiden mutation, heterozygous (HCC)   . Meniere disease   . Migraine   . PCO (polycystic ovaries)     Past Surgical History:  Procedure Laterality Date  . CESAREAN SECTION    . NASAL SINUS SURGERY    . PELVIC LAPAROSCOPY    . SMALL INTESTINE SURGERY      There were no vitals filed for this visit.  Subjective Assessment - 09/26/18 1123    Subjective  Patient reports her RLE symptoms are the same allow rash is gone with raw/open pain. Patient reports some soreness following last session that she reports is better today. Patient reports Thursday     Pertinent History  Patient is a 38 year old female presenting with L hip pain. Patient reports chronic history with LBP (spondylolesthesis) with L hip pain exacerbated "a couple months ago". Patient reports insideous onset with gradually worsening pain in L hip over the past couple months. Patient reports her pain is in a C shape from groin around to deep buttock that is a deep, annoying/nagging pain. Patient reports she has had burning/tingling sensation down posterior buttock to the bottom of the foot, that "comes and goes". Patient reports the she is having some "tingling down front  of R thigh that is how the LLE pain started". Patient reports she cannot sleep on the L side, or sit for extended periods periods. 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  Patient reports worst pain over past week 4/10 best 2/10.     Limitations  Sitting;House hold activities    How long can you sit comfortably?  5-10 min    How long can you stand comfortably?  10-35mins    How long can you walk comfortably?  unlimited; feels better    Diagnostic tests  MRI and EMG negative for labrum tear and nerve issues    Patient Stated Goals  Be able to sleep and sit without pain    Pain Onset  More than a month ago       Ther-Ex - TRX SL squat 3x 10 with cuing for setup to attempt to isolate hip without UE compensation - SL deadlift 10# DB bilat with good carry over following demo and cuing for initially set up - Squat on bosu ball 3x 10 with min cuing to maintain hip/knee/ankle alignment with good carry over following - Forward lunge with cuing for eccentric control 3x 10 each side with good carry over following demo and min cuing - MATRIX hip abd 55# 3x 10 with cuing to prevent posterior  hip translation                       PT Education - 09/26/18 1143    Education Details  Exercise form    Person(s) Educated  Patient    Methods  Explanation;Demonstration;Verbal cues    Comprehension  Verbalized understanding;Returned demonstration;Verbal cues required       PT Short Term Goals - 08/22/18 1214      PT SHORT TERM GOAL #1   Title  Pt will be independent with HEP in order to improve strength and balance in order to decrease fall risk and improve function at home and work.    Time  8    Period  Weeks    Status  New        PT Long Term Goals - 08/22/18 1215      PT LONG TERM GOAL #1   Title  Pt will decrease worst pain as reported on NPRS by at least 3 points in order to demonstrate clinically significant reduction in pain.    Baseline  08/22/18 4/10     Time  8    Period  Weeks    Status  New      PT LONG TERM GOAL #2   Title  Patient will increase FOTO score to 69 to demonstrate predicted increase in functional mobility to complete ADLs    Baseline  08/22/18 67    Time  8    Period  Weeks    Status  New      PT LONG TERM GOAL #3   Title  Patient will report no pain with sitting in order to complete work duties with precision    Baseline  08/22/18 Sit for 5-10 mins with 4/10 pain and needing to stretch/stand    Time  8    Period  Weeks    Status  New            Plan - 09/26/18 1444    Clinical Impression Statement  PT continued to hold of manual techniques d/t skin irritation, to hopefully continue in following sessions for pain management following epidural on Thursday. Patient is able to complete therex with accuracy following PT demo and cuing, with proper muscle activiaton.     Rehab Potential  Good    Clinical Impairments Affecting Rehab Potential  (+) age, lack of other comorbidities (-) sedentary lifestyle, chronicity of pain, multiple pain sites    PT Frequency  2x / week    PT Duration  8 weeks    PT Treatment/Interventions  Aquatic Therapy;Electrical Stimulation;Cryotherapy;Moist Heat;Iontophoresis /ml Dexamethasone;Ultrasound;Traction;Gait training;Stair training;Therapeutic activities;Therapeutic exercise;Neuromuscular re-education;Functional mobility training;Balance training;Patient/family education;Manual techniques;Dry needling;Passive range of motion;Taping;Spinal Manipulations;Joint Manipulations    PT Next Visit Plan  Decrease soft tissue tension and core stability as able    PT Home Exercise Plan  adductor, glute med stretching    Consulted and Agree with Plan of Care  Patient       Patient will benefit from skilled therapeutic intervention in order to improve the following deficits and impairments:  Decreased activity tolerance, Decreased endurance, Decreased range of motion, Increased fascial  restricitons, Improper body mechanics, Pain, Decreased mobility, Postural dysfunction, Impaired tone, Impaired flexibility, Increased muscle spasms  Visit Diagnosis: Pain in left hip     Problem List Patient Active Problem List   Diagnosis Date Noted  . Shingles, Lumbar (Right) 09/20/2018  . Acute pain of right lower extremity 09/20/2018  .  Acute lumbar radiculopathy (Right) (L2) 09/20/2018  . Chronic pain of right lower extremity 09/11/2018  . Lumbar spine instability 08/31/2018  . Chronic lumbar radiculitis (S1) (Left) 08/23/2018  . Neurogenic pain 08/23/2018  . DDD (degenerative disc disease), lumbosacral 08/23/2018  . Osteoarthritis of hip (Left) 08/08/2018  . Abnormal MRI, lumbar spine (12/23/2017) 07/24/2018  . Lumbar facet arthropathy 07/24/2018  . Lumbar facet syndrome 07/24/2018  . Grade 1 Anterolisthesis L5/S1 07/24/2018  . Lumbar lateral recess stenosis 07/24/2018  . Lumbar foraminal stenosis 07/24/2018  . Lumbar nerve root impingement (L5) (Left) 07/24/2018  . Lumbar pars defect (L5) (Bilateral) 07/24/2018  . Lumbar L5 pars defect w/ spondylolisthesis 07/24/2018  . Chronic musculoskeletal pain 07/24/2018  . Chronic sacroiliac joint pain [facility] Area of Pain) (Right) 07/24/2018  . Family history of factor V Leiden mutation 06/29/2018  . GAD (generalized anxiety disorder) 06/29/2018  . Lyme disease 06/29/2018  . Major depression 06/29/2018  . Spondylosis 06/29/2018  . Vaginal infection 06/29/2018  . Chronic groin pain (Primary Area of Pain) (Left) 06/29/2018  . Chronic hip pain (Secondary Area of Pain) (Left) 06/29/2018  . Chronic low back pain (Fourth Area of Pain) (Bilateral) (L>R) w/ sciatica (Bilateral) 06/29/2018  . Chronic lower extremity pain (Bilateral) (L>R) 06/29/2018  . Chronic pain syndrome 06/29/2018  . Pharmacologic therapy 06/29/2018  . Disorder of skeletal system 06/29/2018  . Problems influencing health status 06/29/2018  . Endometriosis  01/10/2018  . Meniere's disease of right ear 06/14/2017  . Allergic rhinitis due to animal hair and dander 02/15/2017  . Dysfunction of right eustachian tube 02/15/2017  . Hearing loss, sensorineural, asymmetrical 02/15/2017  . Vestibular dizziness 02/15/2017  . Insomnia 06/17/2014  . Snoring 06/17/2014  . DDD (degenerative disc disease), cervical 05/21/2014  . Migraines 05/21/2014  . Spondylolisthesis at L5-S1 level 05/21/2014   Staci Acosta PT, DPT Staci Acosta 09/26/2018, 2:50 PM  [facility] PHYSICAL AND SPORTS MEDICINE [address] [place], [address] Phone: [phone]   Fax:  [phone]  Name: Amber Reed MRN: [id number] Date of Birth: June 07, 1981

## 2018-09-28 ENCOUNTER — Ambulatory Visit
Admission: RE | Admit: 2018-09-28 | Discharge: 2018-09-28 | Disposition: A | Discharge status: Home / Self Care | Payer: Managed Care, Other (non HMO) | Source: Ambulatory Visit | Attending: Pain Medicine | Admitting: Pain Medicine

## 2018-09-28 ENCOUNTER — Ambulatory Visit: Payer: Managed Care, Other (non HMO) | Admitting: Physical Therapy

## 2018-09-28 ENCOUNTER — Encounter: Payer: Self-pay | Admitting: Pain Medicine

## 2018-09-28 ENCOUNTER — Other Ambulatory Visit: Payer: Self-pay

## 2018-09-28 ENCOUNTER — Ambulatory Visit (HOSPITAL_BASED_OUTPATIENT_CLINIC_OR_DEPARTMENT_OTHER): Payer: Managed Care, Other (non HMO) | Admitting: Pain Medicine

## 2018-09-28 VITALS — BP 115/80 | HR 93 | Temp 98.4°F | Resp 12 | Ht 71.0 in | Wt 195.0 lb

## 2018-09-28 DIAGNOSIS — M5416 Radiculopathy, lumbar region: Secondary | ICD-10-CM

## 2018-09-28 DIAGNOSIS — M5137 Other intervertebral disc degeneration, lumbosacral region: Secondary | ICD-10-CM | POA: Insufficient documentation

## 2018-09-28 DIAGNOSIS — B029 Zoster without complications: Secondary | ICD-10-CM

## 2018-09-28 DIAGNOSIS — M79604 Pain in right leg: Secondary | ICD-10-CM | POA: Diagnosis present

## 2018-09-28 MED ORDER — TRIAMCINOLONE ACETONIDE 40 MG/ML IJ SUSP
40.0000 mg | Freq: Once | INTRAMUSCULAR | Status: AC
  Administered 2018-09-28: 40 mg

## 2018-09-28 MED ORDER — IOPAMIDOL (ISOVUE-M 200) INJECTION 41%
10.0000 mL | Freq: Once | INTRAMUSCULAR | Status: AC
  Administered 2018-09-28: 10 mL via EPIDURAL

## 2018-09-28 MED ORDER — FENTANYL CITRATE (PF) 100 MCG/2ML IJ SOLN
25.0000 ug | INTRAMUSCULAR | Status: DC | PRN
  Administered 2018-09-28: 50 ug via INTRAVENOUS

## 2018-09-28 MED ORDER — LIDOCAINE HCL 2 % IJ SOLN
20.0000 mL | Freq: Once | INTRAMUSCULAR | Status: AC
  Administered 2018-09-28: 400 mg

## 2018-09-28 MED ORDER — ROPIVACAINE HCL 2 MG/ML IJ SOLN
INTRAMUSCULAR | Status: AC
  Filled 2018-09-28: qty 10

## 2018-09-28 MED ORDER — FENTANYL CITRATE (PF) 100 MCG/2ML IJ SOLN
INTRAMUSCULAR | Status: AC
  Filled 2018-09-28: qty 2

## 2018-09-28 MED ORDER — LIDOCAINE HCL 2 % IJ SOLN
INTRAMUSCULAR | Status: AC
  Filled 2018-09-28: qty 20

## 2018-09-28 MED ORDER — SODIUM CHLORIDE (PF) 0.9 % IJ SOLN
INTRAMUSCULAR | Status: AC
  Filled 2018-09-28: qty 10

## 2018-09-28 MED ORDER — TRIAMCINOLONE ACETONIDE 40 MG/ML IJ SUSP
INTRAMUSCULAR | Status: AC
  Filled 2018-09-28: qty 1

## 2018-09-28 MED ORDER — SODIUM CHLORIDE 0.9% FLUSH
2.0000 mL | Freq: Once | INTRAVENOUS | Status: AC
  Administered 2018-09-28: 2 mL

## 2018-09-28 MED ORDER — MIDAZOLAM HCL 5 MG/5ML IJ SOLN
INTRAMUSCULAR | Status: AC
  Filled 2018-09-28: qty 5

## 2018-09-28 MED ORDER — ROPIVACAINE HCL 2 MG/ML IJ SOLN
2.0000 mL | Freq: Once | INTRAMUSCULAR | Status: AC
  Administered 2018-09-28: 2 mL via EPIDURAL

## 2018-09-28 MED ORDER — MIDAZOLAM HCL 5 MG/5ML IJ SOLN
1.0000 mg | INTRAMUSCULAR | Status: DC | PRN
  Administered 2018-09-28: 3 mg via INTRAVENOUS

## 2018-09-28 MED ORDER — LACTATED RINGERS IV SOLN
1000.0000 mL | Freq: Once | INTRAVENOUS | Status: AC
  Administered 2018-09-28: 1000 mL via INTRAVENOUS

## 2018-09-28 MED ORDER — IOPAMIDOL (ISOVUE-M 200) INJECTION 41%
INTRAMUSCULAR | Status: AC
  Filled 2018-09-28: qty 10

## 2018-09-28 NOTE — Progress Notes (Signed)
Patient's Name: Amber Reed  MRN: [id number]  Referring Provider: Delano MetzNaveira, Zymeir Salminen, MD  DOB: 04/28/1981  PCP: Yates DecampBlackwell, Samuel Dwight, MD  DOS: 09/28/2018  Note by: Oswaldo DoneFrancisco A Jaydalee Bardwell, MD  Service setting: Ambulatory outpatient  Specialty: Interventional Pain Management  Patient type: Established  Location: [facility] (AMB) Pain Management Facility  Visit type: Interventional Procedure   Primary Reason for Visit: Interventional Pain Management Treatment. CC: Back Pain  Procedure:          Anesthesia, Analgesia, Anxiolysis:  Type: Therapeutic Inter-Laminar Epidural Steroid Injection  #1  Region: Lumbar Level: L2-3 Level. Laterality: Right-Sided Paramedial  Type: Moderate (Conscious) Sedation combined with Local Anesthesia Indication(s): Analgesia and Anxiety Route: Intravenous (IV) IV Access: Secured Sedation: Meaningful verbal contact was maintained at all times during the procedure  Local Anesthetic: Lidocaine 1-2%  Position: Prone with head of the table was raised to facilitate breathing.   Indications: 1. DDD (degenerative disc disease), lumbosacral   2. Herpes zoster without complication   3. Acute lumbar radiculopathy (Right) (L2)   4. Acute pain of right lower extremity    Pain Score: Pre-procedure: 4 /10 Post-procedure: 0-No pain/10  Pre-op Assessment:  Amber Reed is a 38 y.o. (year old), female patient, seen today for interventional treatment. She  has a past surgical history that includes Cesarean section; Nasal sinus surgery; Pelvic laparoscopy; and Small intestine surgery. Amber Reed has a current medication list which includes the following prescription(s): betahistine dihydrochloride, cyanocobalamin-methylcobalamin, diclofenac, gabapentin, hydrochlorothiazide, hydrochlorothiazide, meclizine, methocarbamol, ondansetron, potassium chloride sa, vitamin e, and azelastine, and the following Facility-Administered Medications: fentanyl and midazolam. Her primarily concern today  is the Back Pain  Initial Vital Signs:  Pulse/HCG Rate: 93ECG Heart Rate: 80 Temp: 98.4 F (36.9 C) Resp: 12 BP: 120/75 SpO2: 98 %  BMI: Estimated body mass index is 27.2 kg/m as calculated from the following:   Height as of this encounter: 5\' 11"  (1.803 m).   Weight as of this encounter: 195 lb (88.5 kg).  Risk Assessment: Allergies: Reviewed. She is allergic to other; triptans; and amoxicillin.  Allergy Precautions: None required Coagulopathies: Reviewed. None identified.  Blood-thinner therapy: None at this time Active Infection(s): Reviewed. None identified. Amber Reed is afebrile  Site Confirmation: Amber Reed was asked to confirm the procedure and laterality before marking the site Procedure checklist: Completed Consent: Before the procedure and under the influence of no sedative(s), amnesic(s), or anxiolytics, the patient was informed of the treatment options, risks and possible complications. To fulfill our ethical and legal obligations, as recommended by the American Medical Association's Code of Ethics, I have informed the patient of my clinical impression; the nature and purpose of the treatment or procedure; the risks, benefits, and possible complications of the intervention; the alternatives, including doing nothing; the risk(s) and benefit(s) of the alternative treatment(s) or procedure(s); and the risk(s) and benefit(s) of doing nothing. The patient was provided information about the general risks and possible complications associated with the procedure. These may include, but are not limited to: failure to achieve desired goals, infection, bleeding, organ or nerve damage, allergic reactions, paralysis, and death. In addition, the patient was informed of those risks and complications associated to Spine-related procedures, such as failure to decrease pain; infection (i.e.: Meningitis, epidural or intraspinal abscess); bleeding (i.e.: epidural hematoma, subarachnoid hemorrhage,  or any other type of intraspinal or peri-dural bleeding); organ or nerve damage (i.e.: Any type of peripheral nerve, nerve root, or spinal cord injury) with subsequent damage to sensory, motor, and/or autonomic  systems, resulting in permanent pain, numbness, and/or weakness of one or several areas of the body; allergic reactions; (i.e.: anaphylactic reaction); and/or death. Furthermore, the patient was informed of those risks and complications associated with the medications. These include, but are not limited to: allergic reactions (i.e.: anaphylactic or anaphylactoid reaction(s)); adrenal axis suppression; blood sugar elevation that in diabetics may result in ketoacidosis or comma; water retention that in patients with history of congestive heart failure may result in shortness of breath, pulmonary edema, and decompensation with resultant heart failure; weight gain; swelling or edema; medication-induced neural toxicity; particulate matter embolism and blood vessel occlusion with resultant organ, and/or nervous system infarction; and/or aseptic necrosis of one or more joints. Finally, the patient was informed that Medicine is not an exact science; therefore, there is also the possibility of unforeseen or unpredictable risks and/or possible complications that may result in a catastrophic outcome. The patient indicated having understood very clearly. We have given the patient no guarantees and we have made no promises. Enough time was given to the patient to ask questions, all of which were answered to the patient's satisfaction. Ms. Salmi has indicated that she wanted to continue with the procedure. Attestation: I, the ordering provider, attest that I have discussed with the patient the benefits, risks, side-effects, alternatives, likelihood of achieving goals, and potential problems during recovery for the procedure that I have provided informed consent. Date  Time: 09/28/2018  8:35 AM  Pre-Procedure  Preparation:  Monitoring: As per clinic protocol. Respiration, ETCO2, SpO2, BP, heart rate and rhythm monitor placed and checked for adequate function Safety Precautions: Patient was assessed for positional comfort and pressure points before starting the procedure. Time-out: I initiated and conducted the "Time-out" before starting the procedure, as per protocol. The patient was asked to participate by confirming the accuracy of the "Time Out" information. Verification of the correct person, site, and procedure were performed and confirmed by me, the nursing staff, and the patient. "Time-out" conducted as per Joint Commission's Universal Protocol (UP.01.01.01). Time: 0931  Description of Procedure:          Target Area: The interlaminar space, initially targeting the lower laminar border of the superior vertebral body. Approach: Paramedial approach. Area Prepped: Entire Posterior Lumbar Region Prepping solution: ChloraPrep (2% chlorhexidine gluconate and 70% isopropyl alcohol) Safety Precautions: Aspiration looking for blood return was conducted prior to all injections. At no point did we inject any substances, as a needle was being advanced. No attempts were made at seeking any paresthesias. Safe injection practices and needle disposal techniques used. Medications properly checked for expiration dates. SDV (single dose vial) medications used. Description of the Procedure: Protocol guidelines were followed. The procedure needle was introduced through the skin, ipsilateral to the reported pain, and advanced to the target area. Bone was contacted and the needle walked caudad, until the lamina was cleared. The epidural space was identified using "loss-of-resistance technique" with 2-3 ml of PF-NaCl (0.9% NSS), in a 5cc LOR glass syringe.  Vitals:   09/28/18 0935 09/28/18 0940 09/28/18 0950 09/28/18 1000  BP: 108/70 112/76 115/80 115/80  Pulse:      Resp: Temp:      SpO2: 97% 98% 98% 100%   Weight:      Height:        Start Time: 0931 hrs. End Time: 0937 hrs.  Materials:  Needle(s) Type: Epidural needle Gauge: 17G Length: 3.5-in Medication(s): Please see orders for medications and dosing details.  Imaging  Guidance (Spinal):          Type of Imaging Technique: Fluoroscopy Guidance (Spinal) Indication(s): Assistance in needle guidance and placement for procedures requiring needle placement in or near specific anatomical locations not easily accessible without such assistance. Exposure Time: Please see nurses notes. Contrast: Before injecting any contrast, we confirmed that the patient did not have an allergy to iodine, shellfish, or radiological contrast. Once satisfactory needle placement was completed at the desired level, radiological contrast was injected. Contrast injected under live fluoroscopy. No contrast complications. See chart for type and volume of contrast used. Fluoroscopic Guidance: I was personally present during the use of fluoroscopy. "Tunnel Vision Technique" used to obtain the best possible view of the target area. Parallax error corrected before commencing the procedure. "Direction-depth-direction" technique used to introduce the needle under continuous pulsed fluoroscopy. Once target was reached, antero-posterior, oblique, and lateral fluoroscopic projection used confirm needle placement in all planes. Images permanently stored in EMR. Interpretation: I personally interpreted the imaging intraoperatively. Adequate needle placement confirmed in multiple planes. Appropriate spread of contrast into desired area was observed. No evidence of afferent or efferent intravascular uptake. No intrathecal or subarachnoid spread observed. Permanent images saved into the patient's record.  Antibiotic Prophylaxis:   Anti-infectives (From admission, onward)   None     Indication(s): None identified  Post-operative Assessment:  Post-procedure Vital Signs:  Pulse/HCG  Rate: 9387 Temp: 98.4 F (36.9 C) Resp: 12 BP: 115/80 SpO2: 100 %  EBL: None  Complications: No immediate post-treatment complications observed by team, or reported by patient.  Note: The patient tolerated the entire procedure well. A repeat set of vitals were taken after the procedure and the patient was kept under observation following institutional policy, for this type of procedure. Post-procedural neurological assessment was performed, showing return to baseline, prior to discharge. The patient was provided with post-procedure discharge instructions, including a section on how to identify potential problems. Should any problems arise concerning this procedure, the patient was given instructions to immediately contact us, at any time, without hesitation. In any case, we plan to contact the patient by telephone for a follow-up status report regarding this interventional procedure.  Comments:  No additional relevant information.  Plan of Care  Orders:  Orders Placed This Encounter  Procedures  . Lumbar Epidural Injection    Scheduling Instructions:     Procedure: Interlaminar LESI L2-3     Laterality: Right-sided     Sedation: Patient's choice     Timeframe:  Today    Order Specific Question:   Where will this procedure be performed?    Answer:   [facility]  . DG C-Arm 1-60 Min-No Report    Intraoperative interpretation by procedural physician at [facility].    Standing Status:   Standing    Number of Occurrences:   1    Order Specific Question:   Reason for exam:    Answer:   Assistance in needle guidance and placement for procedures requiring needle placement in or near specific anatomical locations not easily accessible without such assistance.  . Informed Consent Details: Transcribe to consent form and obtain patient signature    Surgeon: Mattheus Rauls A. Laban Emperor, MD    Scheduling Instructions:     Procedure: Lumbar epidural steroid injection under  fluoroscopic guidance     Indications: Low back and/or lower extremity pain secondary to lumbar radiculitis  . Provider attestation of informed consent for procedure/surgical case    I, the ordering provider,  attest that I have discussed with the patient the benefits, risks, side effects, alternatives, likelihood of achieving goals and potential problems during recovery for the procedure that I have provided informed consent.    Standing Status:   Standing    Number of Occurrences:   1  . Follow-up    Post-procedure Phone Call: Call patient tomorrow for routine early follow-up evaluation.  Return Appointment Timeframe: Approximately 2 weeks, depending on appointment availability.    Standing Status:   Standing    Number of Occurrences:   1    Order Specific Question:   Specify    Answer:   Schedule a return appointment for post-procedure evaluation. In addition arrange for patient to receive a follow-up phone call tomorrow to assess post-procedure status.   Medications ordered for procedure: Meds ordered this encounter  Medications  . iopamidol (ISOVUE-M) 41 % intrathecal injection 10 mL    Must be Myelogram-compatible. If not available, you may substitute with a water-soluble, non-ionic, hypoallergenic, myelogram-compatible radiological contrast medium.  Marland Kitchen lidocaine (XYLOCAINE) 2 % (with pres) injection 400 mg  . midazolam (VERSED) 5 MG/5ML injection 1-2 mg    Make sure Flumazenil is available in the pyxis when using this medication. If oversedation occurs, administer 0.2 mg IV over 15 sec. If after 45 sec no response, administer 0.2 mg again over 1 min; may repeat at 1 min intervals; not to exceed 4 doses (1 mg)  . fentaNYL (SUBLIMAZE) injection 25-50 mcg    Make sure Narcan is available in the pyxis when using this medication. In the event of respiratory depression (RR< 8/min): Titrate NARCAN (naloxone) in increments of 0.1 to 0.2 mg IV at 2-3 minute intervals, until desired degree of  reversal.  . lactated ringers infusion 1,000 mL  . sodium chloride flush (NS) 0.9 % injection 2 mL  . ropivacaine (PF) 2 mg/mL (0.2%) (NAROPIN) injection 2 mL  . triamcinolone acetonide (KENALOG-40) injection 40 mg   Medications administered: We administered iopamidol, lidocaine, midazolam, fentaNYL, lactated ringers, sodium chloride flush, ropivacaine (PF) 2 mg/mL (0.2%), and triamcinolone acetonide.  See the medical record for exact dosing, route, and time of administration.  Disposition: Discharge home  Discharge Date & Time: 09/28/2018; 1010 hrs.   Follow-up plan:   Return for EPP (2 wks) w/ Dr. Laban Emperor.     Future Appointments  Date Time Provider Department Center  10/03/2018 11:15 AM Staci Acosta, PT [facility] None  10/05/2018 11:15 AM Staci Acosta, PT [facility] None  10/10/2018 11:15 AM Staci Acosta, PT [facility] None  10/12/2018 11:15 AM Staci Acosta, PT [facility] None  10/18/2018  8:15 AM Delano Metz, MD [facility] None   Primary Care Physician: Yates Decamp, MD Location: [facility] Note by: Oswaldo Done, MD Date: 09/28/2018; Time: 10:50 AM  Disclaimer:  Medicine is not an Visual merchandiser. The only guarantee in medicine is that nothing is guaranteed. It is important to note that the decision to proceed with this intervention was based on the information collected from the patient. The Data and conclusions were drawn from the patient's questionnaire, the interview, and the physical examination. Because the information was provided in large part by the patient, it cannot be guaranteed that it has not been purposely or unconsciously manipulated. Every effort has been made to obtain as much relevant data as possible for this evaluation. It is important to note that the conclusions that lead to this procedure are derived in large part from the available data. Always  take into account that the treatment will also be dependent on  availability of resources and existing treatment guidelines, considered by other Pain Management Practitioners as being common knowledge and practice, at the time of the intervention. For Medico-Legal purposes, it is also important to point out that variation in procedural techniques and pharmacological choices are the acceptable norm. The indications, contraindications, technique, and results of the above procedure should only be interpreted and judged by a Board-Certified Interventional Pain Specialist with extensive familiarity and expertise in the same exact procedure and technique.

## 2018-09-28 NOTE — Patient Instructions (Addendum)

## 2018-09-29 ENCOUNTER — Telehealth: Payer: Self-pay

## 2018-09-29 NOTE — Telephone Encounter (Signed)
Post procedure phone call.  Patient states her head is hurting a little bit but not as bad as last time.. Instructed patient to call us back for any questions or concerns.   Called patient back to reassess headache.  She layed down while I was on the phone and sat up and statesd that there really wasn't much of a change but would call us back in a couple of hours if it didn't improve.

## 2018-10-03 ENCOUNTER — Ambulatory Visit: Payer: Managed Care, Other (non HMO) | Admitting: Physical Therapy

## 2018-10-05 ENCOUNTER — Ambulatory Visit: Payer: Managed Care, Other (non HMO) | Admitting: Physical Therapy

## 2018-10-09 ENCOUNTER — Other Ambulatory Visit: Payer: Self-pay | Admitting: Nurse Practitioner

## 2018-10-10 ENCOUNTER — Encounter: Payer: Managed Care, Other (non HMO) | Admitting: Physical Therapy

## 2018-10-12 ENCOUNTER — Encounter: Payer: Managed Care, Other (non HMO) | Admitting: Physical Therapy

## 2018-10-15 ENCOUNTER — Encounter: Payer: Self-pay | Admitting: Physical Therapy

## 2018-10-15 NOTE — Therapy (Unsigned)
[facility] [address] [place], [address] Phone: [phone]   Fax:  [phone]  Patient Details  Name: Amber Reed MRN: [id number] Date of Birth: [birth year] Referring Provider:  No ref. provider found  Encounter Date: 10/15/2018 Left a message with patient to follow up on any PT needs, HEP progress, and interest in telehealth therapy.   Staci Acosta PT, DPT  Staci Acosta 10/15/2018, 3:37 PM  [facility] SPOR[facility]IC[facility] [address], [place], [address] Phone: [phone]   Fax:  [phone]

## 2018-10-18 ENCOUNTER — Ambulatory Visit: Payer: Managed Care, Other (non HMO) | Attending: Pain Medicine | Admitting: Pain Medicine

## 2018-10-18 ENCOUNTER — Other Ambulatory Visit: Payer: Self-pay

## 2018-10-18 DIAGNOSIS — G8929 Other chronic pain: Secondary | ICD-10-CM

## 2018-10-18 DIAGNOSIS — M5137 Other intervertebral disc degeneration, lumbosacral region: Secondary | ICD-10-CM

## 2018-10-18 DIAGNOSIS — M5442 Lumbago with sciatica, left side: Secondary | ICD-10-CM | POA: Diagnosis not present

## 2018-10-18 DIAGNOSIS — M7918 Myalgia, other site: Secondary | ICD-10-CM | POA: Diagnosis not present

## 2018-10-18 DIAGNOSIS — M51379 Other intervertebral disc degeneration, lumbosacral region without mention of lumbar back pain or lower extremity pain: Secondary | ICD-10-CM

## 2018-10-18 DIAGNOSIS — G894 Chronic pain syndrome: Secondary | ICD-10-CM

## 2018-10-18 DIAGNOSIS — M5441 Lumbago with sciatica, right side: Secondary | ICD-10-CM

## 2018-10-18 MED ORDER — METHOCARBAMOL 500 MG PO TABS: 500.0000 mg | ORAL_TABLET | Freq: Every day | ORAL | 2 refills | Status: DC

## 2018-10-18 NOTE — Patient Instructions (Signed)

## 2018-10-18 NOTE — Progress Notes (Signed)
Pain Management Encounter Note - Virtual Visit via Telephone Telehealth (real-time audio visits between healthcare provider and patient).  Patient's Phone No. & Preferred Pharmacy:  [phone] (home); [phone] (mobile); (Preferred) [phone]  [facility] #[id number] [place], [place] - [address] AT [facility] [address] [place] [address] Phone: [phone] Fax: [phone]  [facility] #[id number] [place], [place] - [address] AT [facility] [address] [place] [address] Phone: [phone] Fax: [phone]   Pre-screening note:  Our staff contacted Amber Reed and offered her an "in person", "face-to-face" appointment versus a telephone encounter. She indicated preferring the telephone encounter, at this time.  Reason for Virtual Visit: COVID-19*  Social distancing based on CDC and AMA recommendations.   I contacted Amber Reed on 10/18/2018 at 8:19 AM by telephone and clearly identified myself as Oswaldo Done, MD. I verified that I was speaking with the correct person using two identifiers (Name and date of birth: Apr 04, 1981).  Advanced Informed Consent I sought verbal advanced consent from Amber Reed for telemedicine interactions and virtual visit. I informed Amber Reed of the security and privacy concerns, risks, and limitations associated with performing an evaluation and management service by telephone. I also informed Amber Reed of the availability of "in person" appointments and I informed her of the possibility of a patient responsible charge related to this service. [name] expressed understanding and agreed to proceed.   Historic Elements   Amber Reed is a 38 y.o. year old, female patient evaluated today after her last encounter by our practice on 10/09/2018. Amber Reed  has a past medical history of Anxiety, Endometriosis, Factor 5 Leiden mutation, heterozygous  (HCC), Meniere disease, Migraine, and PCO (polycystic ovaries). She also  has a past surgical history that includes Cesarean section; Nasal sinus surgery; Pelvic laparoscopy; and Small intestine surgery. Amber Reed has a current medication list which includes the following prescription(s): azelastine, betahistine dihydrochloride, cyanocobalamin-methylcobalamin, diclofenac, gabapentin, hydrochlorothiazide, hydrochlorothiazide, meclizine, methocarbamol, ondansetron, potassium chloride sa, and vitamin e. She  reports that she has never smoked. She has never used smokeless tobacco. She reports current alcohol use. She reports previous drug use. Amber Reed is allergic to other; triptans; and amoxicillin.   HPI  I last saw her on 09/28/2018. She is being evaluated for a post-procedure assessment.  The patient indicates having done extremely well with the procedure, without any type of complications.  She does feel that she improved more on the right side, which is the side that we treated.  However, she is seen some of the pain returned on the left than she would like for the left side to be treated.  We will set this up to be done as soon as the COVID-19 restrictions are lifted.  Post-Procedure Evaluation  Procedure: Diagnostic right-sided L2-3 interlaminar LESI #1 under fluoroscopic guidance and IV sedation Pre-procedure pain level:  4/10 Post-procedure: 0/10 (100% relief)  Sedation: Sedation provided.  Effectiveness during initial hour after procedure(Ultra-Short Term Relief): 100 %  Local anesthetic used: Long-acting (4-6 hours) Effectiveness: Defined as any analgesic benefit obtained secondary to the administration of local anesthetics. This carries significant diagnostic value as to the etiological location, or anatomical origin, of the pain. Duration of benefit is expected to coincide with the duration of the local anesthetic used.  Effectiveness during initial 4-6 hours after procedure(Short-Term  Relief): 100 %  Long-term benefit: Defined as  any relief past the pharmacologic duration of the local anesthetics.  Effectiveness past the initial 6 hours after procedure(Long-Term Relief): 75 % x 1 week, then improved more.  Current benefits: Defined as benefit that persist at this time.   Analgesia:  90-100% better, primarily on the right side that we treated.  She indicates that she still has some pain that is beginning to come back on the left side and she would like for that side to be done. Function: Amber Reed reports improvement in function ROM: Amber Reed reports improvement in ROM  Review of recent tests  DG C-Arm 1-60 Min-No Report Fluoroscopy was utilized by the requesting physician.  No radiographic  interpretation.    Office Visit on 06/29/2018  Component Date Value Ref Range Status  . Summary 06/29/2018 FINAL   Final   Comment: ==================================================================== TOXASSURE COMP DRUG ANALYSIS,UR ==================================================================== Test                             Result       Flag       Units Drug Present not Declared for Prescription Verification   Ephedrine/Pseudoephedrine      PRESENT      UNEXPECTED   Phenylpropanolamine            PRESENT      UNEXPECTED    Source of ephedrine/pseudoephedrine is most commonly    pseudoephedrine in over-the-counter or prescription cold and    allergy medications. Phenylpropanolamine is an expected    metabolite of ephedrine/pseudoephedrine.   Naproxen                       PRESENT      UNEXPECTED   Doxylamine                     PRESENT      UNEXPECTED   Guaifenesin                    PRESENT      UNEXPECTED    Guaifenesin may be administered as an over-the-counter or    prescription drug; it may also be present as a breakdown product    of methocarbamol. Drug Absent but Declared for Prescri                          ption Verification   Diclofenac                      Not Detected UNEXPECTED    Diclofenac, as indicated in the declared medication list, is not    always detected even when used as directed. ==================================================================== Test                      Result    Flag   Units      Ref Range   Creatinine              90               mg/dL      >=76 ==================================================================== Declared Medications:  The flagging and interpretation on this report are based on the  following declared medications.  Unexpected results may arise from  inaccuracies in the declared medications.  **Note: The testing scope of this panel does not include small to  moderate amounts of these  reported medications:  Diclofenac  **Note: The testing scope of this panel does not include following  reported medications:  Azelastine  Betahistine  Cyanocobalamin  Hydrochlorothiazide  Ondansetron  Vitamin E ======================                          ============================================== For clinical consultation, please call [phone]. ====================================================================   . Glucose 06/29/2018 93  65 - 99 mg/dL Final  . BUN 29/52/841312/19/2019 13  6 - 20 mg/dL Final  . Creatinine, Ser 06/29/2018 0.75  0.57 - 1.00 mg/dL Final  . GFR calc non Af Amer 06/29/2018 102  >59 mL/min/1.73 Final  . GFR calc Af Amer 06/29/2018 118  >59 mL/min/1.73 Final  . BUN/Creatinine Ratio 06/29/2018 17  9 - 23 Final  . Sodium 06/29/2018 139  134 - 144 mmol/L Final  . Potassium 06/29/2018 3.8  3.5 - 5.2 mmol/L Final  . Chloride 06/29/2018 99  96 - 106 mmol/L Final  . Calcium 06/29/2018 9.9  8.7 - 10.2 mg/dL Final  . Total Protein 06/29/2018 7.1  6.0 - 8.5 g/dL Final  . Albumin 24/40/102712/19/2019 4.8  3.5 - 5.5 g/dL Final  . Globulin, Total 06/29/2018 2.3  1.5 - 4.5 g/dL Final  . Albumin/Globulin Ratio 06/29/2018 2.1  1.2 - 2.2 Final  . Bilirubin Total 06/29/2018 0.7  0.0 - 1.2  mg/dL Final  . Alkaline Phosphatase 06/29/2018 68  39 - 117 IU/L Final  . AST 06/29/2018 15  0 - 40 IU/L Final  . Magnesium 06/29/2018 1.9  1.6 - 2.3 mg/dL Final  . Vitamin O-53B-12 66/44/034712/19/2019 1,548* 232 - 1,245 pg/mL Final  . Sed Rate 06/29/2018 15  0 - 32 mm/hr Final  . 25-Hydroxy, Vitamin D 06/29/2018 45  ng/mL Final   Comment: Reference Range: All Ages: Target levels 30 - 100   . 25-Hydroxy, Vitamin D-2 06/29/2018 <1.0  ng/mL Final  . 25-Hydroxy, Vitamin D-3 06/29/2018 44  ng/mL Final  . CRP 06/29/2018 7  0 - 10 mg/L Final   Assessment  The primary encounter diagnosis was Chronic pain syndrome. 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  Plan of Care  I am having Amber Reed maintain her azelastine, Betahistine Dihydrochloride, Cyanocobalamin-Methylcobalamin, diclofenac, ondansetron, vitamin E, hydrochlorothiazide, meclizine, potassium chloride SA, hydrochlorothiazide, gabapentin, and methocarbamol.  Pharmacotherapy (Medications Ordered): Meds ordered this encounter  Medications  . methocarbamol (ROBAXIN) 500 MG tablet    Sig: Take 1 tablet (500 mg total) by mouth at bedtime.    Dispense:  30 tablet    Refill:  2    Do not place this medication, or any other prescription from our practice, on "Automatic Refill". Patient may have prescription filled one day early if pharmacy is closed on scheduled refill date.   Orders:  Orders Placed This Encounter  Procedures  . Lumbar Epidural Injection    Standing Status:   Future    Standing Expiration Date:   11/17/2018    Scheduling Instructions:     Procedure: Interlaminar Lumbar Epidural Steroid injection (LESI)  L2-3     Laterality: Left-sided     Sedation: With Sedation.     Timeframe: ASAA    Order Specific Question:   Where will this procedure be performed?     Answer:   [facility]   Follow-up plan:  Return for Procedure (w/ sedation): (L) L2-3 LESI #2.   Interventional management options: Considering:   Diagnostic left-sided L5 + S1 transforaminal ESI #2+ diagnostic right-sided L4-5 LESI #2 Diagnostic left L4-5 interlaminar LESI Diagnostic left L4 transforaminal ESI Diagnosticleft L5-S1LESI Diagnostic bilateral vsleft L5 transforaminal ESI Diagnostic bilateral lumbar facet blocks Possible bilateral lumbar facetRFA Diagnosticleft lateral femoralcutaneousnerve block Diagnostic left sacroiliac joint injection Possible left sacroiliacRFA   Palliative PRN treatment(s):   Diagnostic left intra-articular hip joint injection#2(100/100/25)   I discussed the assessment and treatment plan with the patient. The patient was provided an opportunity to ask questions and all were answered. The patient agreed with the plan and demonstrated an understanding of the instructions.  Patient advised to call back or seek an in-person evaluation if the symptoms or condition worsens.  Total duration of non-face-to-face encounter: 12 minutes.  Note by: Oswaldo Done, MD Date: 10/18/2018; Time: 8:19 AM  Disclaimer:  * Given the special circumstances of the COVID-19 pandemic, the federal government has announced that the Office for Civil Rights (OCR) will exercise its enforcement discretion and will not impose penalties on physicians using telehealth in the event of noncompliance with regulatory requirements under the [employer] Portability and Accountability Act (HIPAA) in connection with the good faith provision of telehealth during the COVID-19 national public health emergency. (AMA)

## 2018-10-24 NOTE — Therapy (Signed)
[facility] MAIN [facility] SERVICES [address] [place], [place], [address] Phone: [phone]   Fax:  [phone]  Patient Details  Name: Amber Reed MRN: [id number] Date of Birth: 1980-11-01 Referring Provider:  No ref. provider found  Encounter Date: 10/24/2018  The [facility] outpatient clinics are closed at this time due to the COVID-19 epidemic. The patient was contacted in regards to their therapy services. The patient is in agreement that they are safe and consent to being on hold for therapy services until the [facility] outpatient facilities reopen. At which time, the patient will be contacted to schedule an appointment to resume therapy services.     Myrene Galas, PT DPT 10/24/2018, 11:28 AM  [facility] MAIN [facility] SERVICES [address] [place], [place], [address] Phone: [phone]   Fax:  [phone]

## 2018-11-07 ENCOUNTER — Other Ambulatory Visit: Payer: Self-pay | Admitting: Pain Medicine

## 2018-11-07 DIAGNOSIS — M792 Neuralgia and neuritis, unspecified: Secondary | ICD-10-CM

## 2018-12-18 ENCOUNTER — Other Ambulatory Visit: Payer: Self-pay | Admitting: Pain Medicine

## 2018-12-25 ENCOUNTER — Other Ambulatory Visit
Admission: RE | Admit: 2018-12-25 | Discharge: 2018-12-25 | Disposition: A | Discharge status: Home / Self Care | Payer: Managed Care, Other (non HMO) | Source: Ambulatory Visit | Attending: Pain Medicine | Admitting: Pain Medicine

## 2018-12-25 ENCOUNTER — Other Ambulatory Visit: Payer: Self-pay

## 2018-12-25 DIAGNOSIS — Z1159 Encounter for screening for other viral diseases: Secondary | ICD-10-CM | POA: Insufficient documentation

## 2018-12-26 LAB — NOVEL CORONAVIRUS, NAA (HOSP ORDER, SEND-OUT TO REF LAB; TAT 18-24 HRS): SARS-CoV-2, NAA: NOT DETECTED

## 2018-12-27 NOTE — Patient Instructions (Signed)

## 2018-12-27 NOTE — Progress Notes (Signed)
Patient's Name: Amber Reed  MRN: [id number]  Referring Provider: Windy CannyBlackwell, Samuel Dwigh*  DOB: 1980/12/18  PCP: Yates DecampBlackwell, Samuel Dwight, MD  DOS: 12/28/2018  Note by: Oswaldo DoneFrancisco A Kelley Knoth, MD  Service setting: Ambulatory outpatient  Specialty: Interventional Pain Management  Patient type: Established  Location: [facility] (AMB) Pain Management Facility  Visit type: Interventional Procedure   Primary Reason for Visit: Interventional Pain Management Treatment. CC: Back Pain  Procedure:          Anesthesia, Analgesia, Anxiolysis:  Type: Therapeutic Inter-Laminar Epidural Steroid Injection  #2  Region: Lumbar Level: L2-3 Level. Laterality: Left Paramedial  Type: Moderate (Conscious) Sedation combined with Local Anesthesia Indication(s): Analgesia and Anxiety Route: Intravenous (IV) IV Access: Secured Sedation: Meaningful verbal contact was maintained at all times during the procedure  Local Anesthetic: Lidocaine 1-2%  Position: Prone with head of the table was raised to facilitate breathing.   Indications: 1. Chronic lumbar radiculitis (S1) (Left)   2. Chronic low back pain (Fourth Area of Pain) (Bilateral) (L>R) w/ sciatica (Bilateral)   3. DDD (degenerative disc disease), lumbosacral   4. Grade 1 Anterolisthesis L5/S1   5. Lumbar L5 pars defect w/ spondylolisthesis   6. Lumbar nerve root impingement (L5) (Left)    Pain Score: Pre-procedure: 6 /10 Post-procedure: 0-No pain/10  Pre-op Assessment:  Amber Reed is a 38 y.o. (year old), female patient, seen today for interventional treatment. She  has a past surgical history that includes Cesarean section; Nasal sinus surgery; Pelvic laparoscopy; and Small intestine surgery. Amber Reed has a current medication list which includes the following prescription(s): betahistine dihydrochloride, cyanocobalamin-methylcobalamin, diclofenac, gabapentin, hydrochlorothiazide, meclizine, methocarbamol, ondansetron, potassium chloride sa, vitamin e,  azelastine, and hydrochlorothiazide, and the following Facility-Administered Medications: fentanyl and midazolam. Her primarily concern today is the Back Pain  Initial Vital Signs:  Pulse/HCG Rate: 92ECG Heart Rate: 86 Temp: 98.6 F (37 C) Resp: 11 BP: 113/61 SpO2: 99 %  BMI: Estimated body mass index is 27.2 kg/m as calculated from the following:   Height as of this encounter: 5\' 11"  (1.803 m).   Weight as of this encounter: 195 lb (88.5 kg).  Risk Assessment: Allergies: Reviewed. She is allergic to other; triptans; and amoxicillin.  Allergy Precautions: None required Coagulopathies: Reviewed. None identified.  Blood-thinner therapy: None at this time Active Infection(s): Reviewed. None identified. Amber Reed is afebrile  Site Confirmation: Amber Reed was asked to confirm the procedure and laterality before marking the site Procedure checklist: Completed Consent: Before the procedure and under the influence of no sedative(s), amnesic(s), or anxiolytics, the patient was informed of the treatment options, risks and possible complications. To fulfill our ethical and legal obligations, as recommended by the American Medical Association's Code of Ethics, I have informed the patient of my clinical impression; the nature and purpose of the treatment or procedure; the risks, benefits, and possible complications of the intervention; the alternatives, including doing nothing; the risk(s) and benefit(s) of the alternative treatment(s) or procedure(s); and the risk(s) and benefit(s) of doing nothing. The patient was provided information about the general risks and possible complications associated with the procedure. These may include, but are not limited to: failure to achieve desired goals, infection, bleeding, organ or nerve damage, allergic reactions, paralysis, and death. In addition, the patient was informed of those risks and complications associated to Spine-related procedures, such as failure  to decrease pain; infection (i.e.: Meningitis, epidural or intraspinal abscess); bleeding (i.e.: epidural hematoma, subarachnoid hemorrhage, or any other type of intraspinal or peri-dural  bleeding); organ or nerve damage (i.e.: Any type of peripheral nerve, nerve root, or spinal cord injury) with subsequent damage to sensory, motor, and/or autonomic systems, resulting in permanent pain, numbness, and/or weakness of one or several areas of the body; allergic reactions; (i.e.: anaphylactic reaction); and/or death. Furthermore, the patient was informed of those risks and complications associated with the medications. These include, but are not limited to: allergic reactions (i.e.: anaphylactic or anaphylactoid reaction(s)); adrenal axis suppression; blood sugar elevation that in diabetics may result in ketoacidosis or comma; water retention that in patients with history of congestive heart failure may result in shortness of breath, pulmonary edema, and decompensation with resultant heart failure; weight gain; swelling or edema; medication-induced neural toxicity; particulate matter embolism and blood vessel occlusion with resultant organ, and/or nervous system infarction; and/or aseptic necrosis of one or more joints. Finally, the patient was informed that Medicine is not an exact science; therefore, there is also the possibility of unforeseen or unpredictable risks and/or possible complications that may result in a catastrophic outcome. The patient indicated having understood very clearly. We have given the patient no guarantees and we have made no promises. Enough time was given to the patient to ask questions, all of which were answered to the patient's satisfaction. Amber Reed has indicated that she wanted to continue with the procedure. Attestation: I, the ordering provider, attest that I have discussed with the patient the benefits, risks, side-effects, alternatives, likelihood of achieving goals, and potential  problems during recovery for the procedure that I have provided informed consent. Date  Time: 12/28/2018  8:17 AM  Pre-Procedure Preparation:  Monitoring: As per clinic protocol. Respiration, ETCO2, SpO2, BP, heart rate and rhythm monitor placed and checked for adequate function Safety Precautions: Patient was assessed for positional comfort and pressure points before starting the procedure. Time-out: I initiated and conducted the "Time-out" before starting the procedure, as per protocol. The patient was asked to participate by confirming the accuracy of the "Time Out" information. Verification of the correct person, site, and procedure were performed and confirmed by me, the nursing staff, and the patient. "Time-out" conducted as per Joint Commission's Universal Protocol (UP.01.01.01). Time: [id number]  Description of Procedure:          Target Area: The interlaminar space, initially targeting the lower laminar border of the superior vertebral body. Approach: Paramedial approach. Area Prepped: Entire Posterior Lumbar Region Prepping solution: DuraPrep (Iodine Povacrylex [0.7% available iodine] and Isopropyl Alcohol, 74% w/w) Safety Precautions: Aspiration looking for blood return was conducted prior to all injections. At no point did we inject any substances, as a needle was being advanced. No attempts were made at seeking any paresthesias. Safe injection practices and needle disposal techniques used. Medications properly checked for expiration dates. SDV (single dose vial) medications used. Description of the Procedure: Protocol guidelines were followed. The procedure needle was introduced through the skin, ipsilateral to the reported pain, and advanced to the target area. Bone was contacted and the needle walked caudad, until the lamina was cleared. The epidural space was identified using "loss-of-resistance technique" with 2-3 ml of PF-NaCl (0.9% NSS), in a 5cc LOR glass syringe.  Vitals:   12/28/18  0856 12/28/18 0906 12/28/18 0916 12/28/18 0926  BP: 115/65 127/61 120/64 121/64  Pulse:      Resp: 14 14 15 14   Temp:  98.3 F (36.8 C)  98.4 F (36.9 C)  SpO2: 93% 95% 96% 96%  Weight:      Height:  Start Time: 0850 hrs. End Time: 0856(L2-3 right 6 cm from skin) hrs.  Materials:  Needle(s) Type: Epidural needle Gauge: 17G Length: 3.5-in Medication(s): Please see orders for medications and dosing details.  Imaging Guidance (Spinal):          Type of Imaging Technique: Fluoroscopy Guidance (Spinal) Indication(s): Assistance in needle guidance and placement for procedures requiring needle placement in or near specific anatomical locations not easily accessible without such assistance. Exposure Time: Please see nurses notes. Contrast: Before injecting any contrast, we confirmed that the patient did not have an allergy to iodine, shellfish, or radiological contrast. Once satisfactory needle placement was completed at the desired level, radiological contrast was injected. Contrast injected under live fluoroscopy. No contrast complications. See chart for type and volume of contrast used. Fluoroscopic Guidance: I was personally present during the use of fluoroscopy. "Tunnel Vision Technique" used to obtain the best possible view of the target area. Parallax error corrected before commencing the procedure. "Direction-depth-direction" technique used to introduce the needle under continuous pulsed fluoroscopy. Once target was reached, antero-posterior, oblique, and lateral fluoroscopic projection used confirm needle placement in all planes. Images permanently stored in EMR. Interpretation: I personally interpreted the imaging intraoperatively. Adequate needle placement confirmed in multiple planes. Appropriate spread of contrast into desired area was observed. No evidence of afferent or efferent intravascular uptake. No intrathecal or subarachnoid spread observed. Permanent images saved into  the patient's record.  Antibiotic Prophylaxis:   Anti-infectives (From admission, onward)   None     Indication(s): None identified  Post-operative Assessment:  Post-procedure Vital Signs:  Pulse/HCG Rate: 9287 Temp: 98.4 F (36.9 C) Resp: 14 BP: 121/64 SpO2: 96 %  EBL: None  Complications: No immediate post-treatment complications observed by team, or reported by patient.  Note: The patient tolerated the entire procedure well. A repeat set of vitals were taken after the procedure and the patient was kept under observation following institutional policy, for this type of procedure. Post-procedural neurological assessment was performed, showing return to baseline, prior to discharge. The patient was provided with post-procedure discharge instructions, including a section on how to identify potential problems. Should any problems arise concerning this procedure, the patient was given instructions to immediately contact us, at any time, without hesitation. In any case, we plan to contact the patient by telephone for a follow-up status report regarding this interventional procedure.  Comments:  No additional relevant information.  Plan of Care  Orders:  Orders Placed This Encounter  Procedures  . Lumbar Epidural Injection    Scheduling Instructions:     Procedure: Interlaminar LESI L2-3     Laterality: Left-sided     Sedation: Patient's choice     Timeframe:  Today    Order Specific Question:   Where will this procedure be performed?    Answer:   [facility]  . DG PAIN CLINIC C-ARM 1-60 MIN NO REPORT    Intraoperative interpretation by procedural physician at [facility].    Standing Status:   Standing    Number of Occurrences:   1    Order Specific Question:   Reason for exam:    Answer:   Assistance in needle guidance and placement for procedures requiring needle placement in or near specific anatomical locations not easily accessible without such  assistance.  . Provider attestation of informed consent for procedure/surgical case    I, the ordering provider, attest that I have discussed with the patient the benefits, risks, side effects, alternatives, likelihood  of achieving goals and potential problems during recovery for the procedure that I have provided informed consent.    Standing Status:   Standing    Number of Occurrences:   1  . Informed Consent Details: Transcribe to consent form and obtain patient signature    Standing Status:   Standing    Number of Occurrences:   1    Order Specific Question:   Procedure    Answer:   Lumbar epidural steroid injection under fluoroscopic guidance. (See notes for level and laterality.)    Order Specific Question:   Surgeon    Answer:   Arie Powell A. Laban Emperor, MD    Order Specific Question:   Indication/Reason    Answer:   Low back pain and/or leg pain secondary to lumbar radiculitis/radiculopathy   Medications ordered for procedure: Meds ordered this encounter  Medications  . iohexol (OMNIPAQUE) 180 MG/ML injection 10 mL    Must be Myelogram-compatible. If not available, you may substitute with a water-soluble, non-ionic, hypoallergenic, myelogram-compatible radiological contrast medium.  Marland Kitchen lidocaine (XYLOCAINE) 2 % (with pres) injection 400 mg  . lactated ringers infusion 1,000 mL  . midazolam (VERSED) 5 MG/5ML injection 1-2 mg    Make sure Flumazenil is available in the pyxis when using this medication. If oversedation occurs, administer 0.2 mg IV over 15 sec. If after 45 sec no response, administer 0.2 mg again over 1 min; may repeat at 1 min intervals; not to exceed 4 doses (1 mg)  . fentaNYL (SUBLIMAZE) injection 25-50 mcg    Make sure Narcan is available in the pyxis when using this medication. In the event of respiratory depression (RR< 8/min): Titrate NARCAN (naloxone) in increments of 0.1 to 0.2 mg IV at 2-3 minute intervals, until desired degree of reversal.  . sodium chloride  flush (NS) 0.9 % injection 2 mL  . ropivacaine (PF) 2 mg/mL (0.2%) (NAROPIN) injection 2 mL  . triamcinolone acetonide (KENALOG-40) injection 40 mg   Medications administered: We administered iohexol, lidocaine, lactated ringers, midazolam, fentaNYL, sodium chloride flush, ropivacaine (PF) 2 mg/mL (0.2%), and triamcinolone acetonide.  See the medical record for exact dosing, route, and time of administration.  Disposition: Discharge home  Discharge Date & Time: 12/28/2018; 0934 hrs.   Follow-up plan:   Return in about 2 weeks (around 01/11/2019) for (VV), E/M (MM).     Future Appointments  Date Time Provider Department Center  01/24/2019 10:15 AM Delano Metz, MD [facility] None   Primary Care Physician: Yates Decamp, MD Location: [facility] Note by: Oswaldo Done, MD Date: 12/28/2018; Time: 9:50 AM  Disclaimer:  Medicine is not an Visual merchandiser. The only guarantee in medicine is that nothing is guaranteed. It is important to note that the decision to proceed with this intervention was based on the information collected from the patient. The Data and conclusions were drawn from the patient's questionnaire, the interview, and the physical examination. Because the information was provided in large part by the patient, it cannot be guaranteed that it has not been purposely or unconsciously manipulated. Every effort has been made to obtain as much relevant data as possible for this evaluation. It is important to note that the conclusions that lead to this procedure are derived in large part from the available data. Always take into account that the treatment will also be dependent on availability of resources and existing treatment guidelines, considered by other Pain Management Practitioners as being common knowledge and practice, at  the time of the intervention. For Medico-Legal purposes, it is also important to point out that variation in  procedural techniques and pharmacological choices are the acceptable norm. The indications, contraindications, technique, and results of the above procedure should only be interpreted and judged by a Board-Certified Interventional Pain Specialist with extensive familiarity and expertise in the same exact procedure and technique.

## 2018-12-28 ENCOUNTER — Encounter: Payer: Self-pay | Admitting: Pain Medicine

## 2018-12-28 ENCOUNTER — Ambulatory Visit
Admission: RE | Admit: 2018-12-28 | Discharge: 2018-12-28 | Disposition: A | Discharge status: Home / Self Care | Payer: 59 | Source: Ambulatory Visit | Attending: Pain Medicine | Admitting: Pain Medicine

## 2018-12-28 ENCOUNTER — Ambulatory Visit (HOSPITAL_BASED_OUTPATIENT_CLINIC_OR_DEPARTMENT_OTHER): Payer: 59 | Admitting: Pain Medicine

## 2018-12-28 ENCOUNTER — Other Ambulatory Visit: Payer: Self-pay

## 2018-12-28 VITALS — BP 121/64 | HR 92 | Temp 98.4°F | Resp 14 | Ht 71.0 in | Wt 195.0 lb

## 2018-12-28 DIAGNOSIS — M5416 Radiculopathy, lumbar region: Secondary | ICD-10-CM | POA: Insufficient documentation

## 2018-12-28 DIAGNOSIS — M4300 Spondylolysis, site unspecified: Secondary | ICD-10-CM | POA: Diagnosis present

## 2018-12-28 DIAGNOSIS — M5442 Lumbago with sciatica, left side: Secondary | ICD-10-CM | POA: Insufficient documentation

## 2018-12-28 DIAGNOSIS — M5137 Other intervertebral disc degeneration, lumbosacral region: Secondary | ICD-10-CM

## 2018-12-28 DIAGNOSIS — M5441 Lumbago with sciatica, right side: Secondary | ICD-10-CM | POA: Insufficient documentation

## 2018-12-28 DIAGNOSIS — M4310 Spondylolisthesis, site unspecified: Secondary | ICD-10-CM | POA: Diagnosis not present

## 2018-12-28 DIAGNOSIS — G8929 Other chronic pain: Secondary | ICD-10-CM | POA: Insufficient documentation

## 2018-12-28 MED ORDER — IOHEXOL 180 MG/ML  SOLN
10.0000 mL | Freq: Once | INTRAMUSCULAR | Status: AC
  Administered 2018-12-28: 10 mL via EPIDURAL

## 2018-12-28 MED ORDER — TRIAMCINOLONE ACETONIDE 40 MG/ML IJ SUSP
40.0000 mg | Freq: Once | INTRAMUSCULAR | Status: AC
  Administered 2018-12-28: 40 mg
  Filled 2018-12-28: qty 1

## 2018-12-28 MED ORDER — ROPIVACAINE HCL 2 MG/ML IJ SOLN
2.0000 mL | Freq: Once | INTRAMUSCULAR | Status: AC
  Administered 2018-12-28: 2 mL via EPIDURAL
  Filled 2018-12-28: qty 10

## 2018-12-28 MED ORDER — LACTATED RINGERS IV SOLN
1000.0000 mL | Freq: Once | INTRAVENOUS | Status: AC
  Administered 2018-12-28: 1000 mL via INTRAVENOUS

## 2018-12-28 MED ORDER — FENTANYL CITRATE (PF) 100 MCG/2ML IJ SOLN
25.0000 ug | INTRAMUSCULAR | Status: DC | PRN
  Administered 2018-12-28: 50 ug via INTRAVENOUS
  Filled 2018-12-28: qty 2

## 2018-12-28 MED ORDER — MIDAZOLAM HCL 5 MG/5ML IJ SOLN
1.0000 mg | INTRAMUSCULAR | Status: DC | PRN
  Administered 2018-12-28: 4 mg via INTRAVENOUS
  Filled 2018-12-28: qty 5

## 2018-12-28 MED ORDER — LIDOCAINE HCL 2 % IJ SOLN
20.0000 mL | Freq: Once | INTRAMUSCULAR | Status: AC
  Administered 2018-12-28: 400 mg
  Filled 2018-12-28: qty 200

## 2018-12-28 MED ORDER — SODIUM CHLORIDE 0.9% FLUSH
2.0000 mL | Freq: Once | INTRAVENOUS | Status: AC
  Administered 2018-12-28: 10 mL

## 2018-12-29 ENCOUNTER — Telehealth: Payer: Self-pay

## 2018-12-29 NOTE — Telephone Encounter (Signed)
Post procedure phone call.  Patient states she is doing good.,   

## 2019-01-23 ENCOUNTER — Encounter: Payer: Self-pay | Admitting: Pain Medicine

## 2019-01-23 NOTE — Progress Notes (Signed)
Attempted Pain Management Virtual Visit via Telephone   Patient's Phone No. & Preferred Pharmacy:  [phone] (home); [phone] (mobile); (Preferred) [phone] [email or web address]   I attempted to contact Amber Reed on 01/24/2019 via telephone. I was unable to complete the visit due to no one answering the phone, or unable to get a proper contact number. I was also unable to leave a message    Post-Procedure Evaluation  Procedure: Diagnostic left-sided L2-3 interlaminar LESI #2 under fluoroscopic guidance and IV sedation Pre-procedure pain level:  6/10 Post-procedure: 0/10 (100% relief)  Sedation: Sedation provided.  Effectiveness during initial hour after procedure(Ultra-Short Term Relief): 100 %   Local anesthetic used: Long-acting (4-6 hours) Effectiveness: Defined as any analgesic benefit obtained secondary to the administration of local anesthetics. This carries significant diagnostic value as to the etiological location, or anatomical origin, of the pain. Duration of benefit is expected to coincide with the duration of the local anesthetic used.  Effectiveness during initial 4-6 hours after procedure(Short-Term Relief): 15 %   Long-term benefit: Defined as any relief past the pharmacologic duration of the local anesthetics.  Effectiveness past the initial 6 hours after procedure(Long-Term Relief): 70 %(is feeling no relief on the left side, 70% on the right)   NOTE: The above information was obtained by the nursing staff during the prescreening call.  Pharmacotherapy Assessment  Analgesic:  None from our practice.   Follow-up plan:   No follow-ups on file.      Interventional management options: Considering: Diagnostic left L4-5 interlaminar LESI Diagnostic left L4 TFESI Diagnosticleft L5-S1LESI Diagnostic bilateral vsleft L5 TFESI Diagnostic bilateral lumbar facet blocks Possible bilateral lumbar facetRFA Diagnosticleft lateral  femoralcutaneousnerve block Diagnostic left sacroiliac joint injection Possible left sacroiliacRFA   Palliative PRN treatment(s): Palliative left-sided L5 TFESI #2 (100/100/100/100)  Palliative left-sided S1 TFESI #2 (100/100/100/100)  Palliative right-sided L4-5 LESI #2 (100/100/100/100)  Palliative left intra-articular hip joint injection#2(100/100/25)  Palliative left-sided L2-3 interlaminar LESI #3 (100/15/70) previously (100/100/75)     Note by: Gaspar Cola, MD Date: 01/24/2019; Time: 7:35 AM

## 2019-01-24 ENCOUNTER — Other Ambulatory Visit: Payer: Self-pay

## 2019-01-24 ENCOUNTER — Ambulatory Visit: Payer: Managed Care, Other (non HMO) | Attending: Pain Medicine | Admitting: Pain Medicine

## 2019-01-24 DIAGNOSIS — M7918 Myalgia, other site: Secondary | ICD-10-CM

## 2019-01-24 DIAGNOSIS — G8929 Other chronic pain: Secondary | ICD-10-CM

## 2019-01-29 ENCOUNTER — Encounter: Payer: Self-pay | Admitting: Pain Medicine

## 2019-03-09 ENCOUNTER — Encounter: Payer: Self-pay | Admitting: Pain Medicine

## 2019-03-09 NOTE — Telephone Encounter (Signed)
Please call Amber Reed (DOB: Oct 17, 1980) (MRN: [id number]) and offer to re-schedule:  Visit: Virtual visit Type: Evaluation

## 2019-03-11 ENCOUNTER — Other Ambulatory Visit: Payer: Self-pay | Admitting: Pain Medicine

## 2019-03-11 DIAGNOSIS — M7918 Myalgia, other site: Secondary | ICD-10-CM

## 2019-03-11 DIAGNOSIS — G8929 Other chronic pain: Secondary | ICD-10-CM

## 2019-03-12 ENCOUNTER — Encounter: Payer: Self-pay | Admitting: Pain Medicine

## 2019-03-13 ENCOUNTER — Ambulatory Visit: Payer: 59 | Attending: Pain Medicine | Admitting: Pain Medicine

## 2019-03-13 ENCOUNTER — Other Ambulatory Visit: Payer: Self-pay

## 2019-03-13 DIAGNOSIS — M4310 Spondylolisthesis, site unspecified: Secondary | ICD-10-CM

## 2019-03-13 DIAGNOSIS — M79605 Pain in left leg: Secondary | ICD-10-CM

## 2019-03-13 DIAGNOSIS — G894 Chronic pain syndrome: Secondary | ICD-10-CM

## 2019-03-13 DIAGNOSIS — M7918 Myalgia, other site: Secondary | ICD-10-CM

## 2019-03-13 DIAGNOSIS — M5442 Lumbago with sciatica, left side: Secondary | ICD-10-CM

## 2019-03-13 DIAGNOSIS — G8929 Other chronic pain: Secondary | ICD-10-CM

## 2019-03-13 DIAGNOSIS — M533 Sacrococcygeal disorders, not elsewhere classified: Secondary | ICD-10-CM

## 2019-03-13 NOTE — Progress Notes (Signed)
Attempted Virtual Visit via Telephone   I attempted to contact Melvenia Needles on 03/13/2019 via telephone. I was unable to complete the visit due to no one answering the phone, or unable to get a proper contact number.   Post-Procedure Evaluation  Procedure: Diagnostic left-sided L2-3 interlaminar LESI #2 under fluoroscopic guidance and IV sedation Pre-procedure pain level:  6/10 Post-procedure: 0/10 (100% relief)  Sedation: Sedation provided.  Effectiveness during initial hour after procedure(Ultra-Short Term Relief): 100 %   Local anesthetic used: Long-acting (4-6 hours) Effectiveness: Defined as any analgesic benefit obtained secondary to the administration of local anesthetics. This carries significant diagnostic value as to the etiological location, or anatomical origin, of the pain. Duration of benefit is expected to coincide with the duration of the local anesthetic used.  Effectiveness during initial 4-6 hours after procedure(Short-Term Relief): 15 %   Long-term benefit: Defined as any relief past the pharmacologic duration of the local anesthetics.  Effectiveness past the initial 6 hours after procedure(Long-Term Relief): 70 %(is feeling no relief on the left side, 70% on the right)   NOTE: The above information was obtained by the nursing staff during the prescreening call.  Pharmacotherapy Assessment  Analgesic: No opioid analgesics prescribed by our practice.   Follow-up plan:   No follow-ups on file.      Interventional management options: Considering: Diagnostic left L4-5 interlaminar LESI Diagnostic left L4 TFESI Diagnosticleft L5-S1LESI Diagnostic bilateral vsleft L5 TFESI Diagnostic bilateral lumbar facet blocks Possible bilateral lumbar facetRFA Diagnosticleft lateral femoralcutaneousnerve block Diagnostic left sacroiliac joint injection Possible left sacroiliacRFA   Palliative PRN treatment(s): Palliative left-sided L5 TFESI #2  (100/100/100/100)  Palliative left-sided S1 TFESI #2 (100/100/100/100)  Palliative right-sided L4-5 LESI #2 (100/100/100/100)  Palliative left intra-articular hip joint injection#2(100/100/25)  Palliative left-sided L2-3 interlaminar LESI #3 (100/15/70) previously (100/100/75)      Recent Visits Date Type Provider Dept  12/28/18 Procedure visit Milinda Pointer, MD [facility]  Showing recent visits within past 90 days and meeting all other requirements   Today's Visits Date Type Provider Dept  03/13/19 Office Visit Milinda Pointer, MD [facility]  Showing today's visits and meeting all other requirements   Future Appointments No visits were found meeting these conditions.  Showing future appointments within next 90 days and meeting all other requirements   Note by: Gaspar Cola, MD Date: 03/13/2019; Time: 4:45 PM

## 2019-05-07 ENCOUNTER — Encounter: Payer: Self-pay | Admitting: Certified Nurse Midwife

## 2019-05-07 ENCOUNTER — Ambulatory Visit (INDEPENDENT_AMBULATORY_CARE_PROVIDER_SITE_OTHER): Payer: Managed Care, Other (non HMO) | Admitting: Certified Nurse Midwife

## 2019-05-07 ENCOUNTER — Other Ambulatory Visit: Payer: Self-pay

## 2019-05-07 VITALS — BP 117/78 | HR 79 | Ht 71.0 in | Wt 202.7 lb

## 2019-05-07 DIAGNOSIS — Z30432 Encounter for removal of intrauterine contraceptive device: Secondary | ICD-10-CM

## 2019-05-07 NOTE — Progress Notes (Signed)
Amber Reed is a 38 y.o. year old G76P1001 Caucasian female who presents for removal of a Mirena IUD. Her Mirena IUD was placed 01/10/2018, requests removal due to mood swings and facial acne. Patient currently using bioidentical hormone replacement therapy prescribed outside of this practice.    BP 117/78   Pulse 79   Ht 5\' 11"  (1.803 m)   Wt 202 lb 11.2 oz (91.9 kg)   LMP  (LMP Unknown)   BMI 28.27 kg/m   Time out was performed.  A small plastic speculum was placed in the vagina.  The cervix was visualized, and the strings were visible. They were grasped and the Mirena was easily removed intact without complications.   Reviewed red flag symptoms and when to call.   RTC x 7 months for [facility] or sooner if needed.    Diona Fanti, CNM [facility], [facility] 05/07/19 10:48 AM

## 2019-05-07 NOTE — Patient Instructions (Signed)
Preventive Care 21-39 Years Old, Female Preventive care refers to visits with your health care provider and lifestyle choices that can promote health and wellness. This includes:  A yearly physical exam. This may also be called an annual well check.  Regular dental visits and eye exams.  Immunizations.  Screening for certain conditions.  Healthy lifestyle choices, such as eating a healthy diet, getting regular exercise, not using drugs or products that contain nicotine and tobacco, and limiting alcohol use. What can I expect for my preventive care visit? Physical exam Your health care provider will check your:  Height and weight. This may be used to calculate body mass index (BMI), which tells if you are at a healthy weight.  Heart rate and blood pressure.  Skin for abnormal spots. Counseling Your health care provider may ask you questions about your:  Alcohol, tobacco, and drug use.  Emotional well-being.  Home and relationship well-being.  Sexual activity.  Eating habits.  Work and work environment.  Method of birth control.  Menstrual cycle.  Pregnancy history. What immunizations do I need?  Influenza (flu) vaccine  This is recommended every year. Tetanus, diphtheria, and pertussis (Tdap) vaccine  You may need a Td booster every 10 years. Varicella (chickenpox) vaccine  You may need this if you have not been vaccinated. Human papillomavirus (HPV) vaccine  If recommended by your health care provider, you may need three doses over 6 months. Measles, mumps, and rubella (MMR) vaccine  You may need at least one dose of MMR. You may also need a second dose. Meningococcal conjugate (MenACWY) vaccine  One dose is recommended if you are age 19-21 years and a first-year college student living in a residence hall, or if you have one of several medical conditions. You may also need additional booster doses. Pneumococcal conjugate (PCV13) vaccine  You may need  this if you have certain conditions and were not previously vaccinated. Pneumococcal polysaccharide (PPSV23) vaccine  You may need one or two doses if you smoke cigarettes or if you have certain conditions. Hepatitis A vaccine  You may need this if you have certain conditions or if you travel or work in places where you may be exposed to hepatitis A. Hepatitis B vaccine  You may need this if you have certain conditions or if you travel or work in places where you may be exposed to hepatitis B. Haemophilus influenzae type b (Hib) vaccine  You may need this if you have certain conditions. You may receive vaccines as individual doses or as more than one vaccine together in one shot (combination vaccines). Talk with your health care provider about the risks and benefits of combination vaccines. What tests do I need?  Blood tests  Lipid and cholesterol levels. These may be checked every 5 years starting at age 20.  Hepatitis C test.  Hepatitis B test. Screening  Diabetes screening. This is done by checking your blood sugar (glucose) after you have not eaten for a while (fasting).  Sexually transmitted disease (STD) testing.  BRCA-related cancer screening. This may be done if you have a family history of breast, ovarian, tubal, or peritoneal cancers.  Pelvic exam and Pap test. This may be done every 3 years starting at age 21. Starting at age 30, this may be done every 5 years if you have a Pap test in combination with an HPV test. Talk with your health care provider about your test results, treatment options, and if necessary, the need for more tests.   Follow these instructions at home: Eating and drinking   Eat a diet that includes fresh fruits and vegetables, whole grains, lean protein, and low-fat dairy.  Take vitamin and mineral supplements as recommended by your health care provider.  Do not drink alcohol if: ? Your health care provider tells you not to drink. ? You are  pregnant, may be pregnant, or are planning to become pregnant.  If you drink alcohol: ? Limit how much you have to 0-1 drink a day. ? Be aware of how much alcohol is in your drink. In the [place] one drink equals one 12 oz bottle of beer (355 mL), one 5 oz glass of wine (148 mL), or one 1 oz glass of hard liquor (44 mL). Lifestyle  Take daily care of your teeth and gums.  Stay active. Exercise for at least 30 minutes on 5 or more days each week.  Do not use any products that contain nicotine or tobacco, such as cigarettes, e-cigarettes, and chewing tobacco. If you need help quitting, ask your health care provider.  If you are sexually active, practice safe sex. Use a condom or other form of birth control (contraception) in order to prevent pregnancy and STIs (sexually transmitted infections). If you plan to become pregnant, see your health care provider for a preconception visit. What's next?  Visit your health care provider once a year for a well check visit.  Ask your health care provider how often you should have your eyes and teeth checked.  Stay up to date on all vaccines. This information is not intended to replace advice given to you by your health care provider. Make sure you discuss any questions you have with your health care provider. Document Released: 08/24/2001 Document Revised: 03/09/2018 Document Reviewed: 03/09/2018 Elsevier Patient Education  2020 Elsevier Inc.  

## 2019-05-07 NOTE — Progress Notes (Signed)
Patient here for Mirena IUD removal due to hormonal issues. Patient currently on bioidentical hormone replacement therapy.

## 2019-07-13 HISTORY — PX: LIPOSUCTION TRUNK: SUR833

## 2019-08-15 ENCOUNTER — Other Ambulatory Visit: Payer: Self-pay | Admitting: Neurosurgery

## 2019-08-15 DIAGNOSIS — M5416 Radiculopathy, lumbar region: Secondary | ICD-10-CM

## 2019-08-15 DIAGNOSIS — M4807 Spinal stenosis, lumbosacral region: Secondary | ICD-10-CM

## 2019-08-24 ENCOUNTER — Ambulatory Visit
Admission: RE | Admit: 2019-08-24 | Discharge: 2019-08-24 | Disposition: A | Discharge status: Home / Self Care | Payer: 59 | Source: Ambulatory Visit | Attending: Neurosurgery | Admitting: Neurosurgery

## 2019-08-24 ENCOUNTER — Other Ambulatory Visit: Payer: Self-pay

## 2019-08-24 DIAGNOSIS — M4807 Spinal stenosis, lumbosacral region: Secondary | ICD-10-CM | POA: Diagnosis not present

## 2019-08-24 DIAGNOSIS — M5416 Radiculopathy, lumbar region: Secondary | ICD-10-CM | POA: Insufficient documentation

## 2019-08-30 ENCOUNTER — Other Ambulatory Visit: Payer: Self-pay | Admitting: Neurosurgery

## 2019-08-30 DIAGNOSIS — M5416 Radiculopathy, lumbar region: Secondary | ICD-10-CM

## 2019-08-30 DIAGNOSIS — M4807 Spinal stenosis, lumbosacral region: Secondary | ICD-10-CM

## 2019-12-28 ENCOUNTER — Encounter: Payer: Self-pay | Admitting: Certified Nurse Midwife

## 2019-12-28 ENCOUNTER — Ambulatory Visit (INDEPENDENT_AMBULATORY_CARE_PROVIDER_SITE_OTHER): Payer: 59 | Admitting: Certified Nurse Midwife

## 2019-12-28 ENCOUNTER — Other Ambulatory Visit: Payer: Self-pay

## 2019-12-28 VITALS — BP 119/63 | HR 78 | Ht 71.0 in | Wt 173.4 lb

## 2019-12-28 DIAGNOSIS — Z23 Encounter for immunization: Secondary | ICD-10-CM | POA: Diagnosis not present

## 2019-12-28 DIAGNOSIS — Z30430 Encounter for insertion of intrauterine contraceptive device: Secondary | ICD-10-CM

## 2019-12-28 LAB — POCT URINE PREGNANCY: Preg Test, Ur: NEGATIVE

## 2019-12-28 MED ORDER — TETANUS-DIPHTH-ACELL PERTUSSIS 5-2.5-18.5 LF-MCG/0.5 IM SUSP
0.5000 mL | Freq: Once | INTRAMUSCULAR | Status: AC
  Administered 2019-12-28: 0.5 mL via INTRAMUSCULAR

## 2019-12-28 NOTE — Patient Instructions (Addendum)
IUD PLACEMENT POST-PROCEDURE INSTRUCTIONS  1. You may take Ibuprofen, Aleve or Tylenol for pain if needed.  Cramping should resolve within in 24 hours.  2. You may have a small amount of spotting.  You should wear a mini pad for the next few days.  3. You may have intercourse after 72 hours.  If you using this for birth control, it is effective immediately.  4. You need to call if you have any pelvic pain, fever, heavy bleeding or foul smelling vaginal discharge.  Irregular bleeding is common the first several months after having an IUD placed. You do not need to call for this reason unless you are concerned.  5. Shower or bathe as normal  6. You should have a follow-up appointment in 4-8 weeks for a re-check to make sure you are not having any problems.   Levonorgestrel intrauterine device (IUD) What is this medicine? LEVONORGESTREL IUD (LEE voe nor jes trel) is a contraceptive (birth control) device. The device is placed inside the uterus by a healthcare professional. It is used to prevent pregnancy. This device can also be used to treat heavy bleeding that occurs during your period. This medicine may be used for other purposes; ask your health care provider or pharmacist if you have questions. COMMON BRAND NAME(S): Kyleena, LILETTA, Mirena, Skyla What should I tell my health care provider before I take this medicine? They need to know if you have any of these conditions:  abnormal Pap smear  cancer of the breast, uterus, or cervix  diabetes  endometritis  genital or pelvic infection now or in the past  have more than one sexual partner or your partner has more than one partner  heart disease  history of an ectopic or tubal pregnancy  immune system problems  IUD in place  liver disease or tumor  problems with blood clots or take blood-thinners  seizures  use intravenous drugs  uterus of unusual shape  vaginal bleeding that has not been explained  an unusual or  allergic reaction to levonorgestrel, other hormones, silicone, or polyethylene, medicines, foods, dyes, or preservatives  pregnant or trying to get pregnant  breast-feeding How should I use this medicine? This device is placed inside the uterus by a health care professional. Talk to your pediatrician regarding the use of this medicine in children. Special care may be needed. Overdosage: If you think you have taken too much of this medicine contact a poison control center or emergency room at once. NOTE: This medicine is only for you. Do not share this medicine with others. What if I miss a dose? This does not apply. Depending on the brand of device you have inserted, the device will need to be replaced every 3 to 6 years if you wish to continue using this type of birth control. What may interact with this medicine? Do not take this medicine with any of the following medications:  amprenavir  bosentan  fosamprenavir This medicine may also interact with the following medications:  aprepitant  armodafinil  barbiturate medicines for inducing sleep or treating seizures  bexarotene  boceprevir  griseofulvin  medicines to treat seizures like carbamazepine, ethotoin, felbamate, oxcarbazepine, phenytoin, topiramate  modafinil  pioglitazone  rifabutin  rifampin  rifapentine  some medicines to treat HIV infection like atazanavir, efavirenz, indinavir, lopinavir, nelfinavir, tipranavir, ritonavir  St. John's wort  warfarin This list may not describe all possible interactions. Give your health care provider a list of all the medicines, herbs, non-prescription drugs, or   dietary supplements you use. Also tell them if you smoke, drink alcohol, or use illegal drugs. Some items may interact with your medicine. What should I watch for while using this medicine? Visit your doctor or health care professional for regular check ups. See your doctor if you or your partner has sexual  contact with others, becomes HIV positive, or gets a sexual transmitted disease. This product does not protect you against HIV infection (AIDS) or other sexually transmitted diseases. You can check the placement of the IUD yourself by reaching up to the top of your vagina with clean fingers to feel the threads. Do not pull on the threads. It is a good habit to check placement after each menstrual period. Call your doctor right away if you feel more of the IUD than just the threads or if you cannot feel the threads at all. The IUD may come out by itself. You may become pregnant if the device comes out. If you notice that the IUD has come out use a backup birth control method like condoms and call your health care provider. Using tampons will not change the position of the IUD and are okay to use during your period. This IUD can be safely scanned with magnetic resonance imaging (MRI) only under specific conditions. Before you have an MRI, tell your healthcare provider that you have an IUD in place, and which type of IUD you have in place. What side effects may I notice from receiving this medicine? Side effects that you should report to your doctor or health care professional as soon as possible:  allergic reactions like skin rash, itching or hives, swelling of the face, lips, or tongue  fever, flu-like symptoms  genital sores  high blood pressure  no menstrual period for 6 weeks during use  pain, swelling, warmth in the leg  pelvic pain or tenderness  severe or sudden headache  signs of pregnancy  stomach cramping  sudden shortness of breath  trouble with balance, talking, or walking  unusual vaginal bleeding, discharge  yellowing of the eyes or skin Side effects that usually do not require medical attention (report to your doctor or health care professional if they continue or are bothersome):  acne  breast pain  change in sex drive or performance  changes in  weight  cramping, dizziness, or faintness while the device is being inserted  headache  irregular menstrual bleeding within first 3 to 6 months of use  nausea This list may not describe all possible side effects. Call your doctor for medical advice about side effects. You may report side effects to FDA at 1-800-FDA-1088. Where should I keep my medicine? This does not apply. NOTE: This sheet is a summary. It may not cover all possible information. If you have questions about this medicine, talk to your doctor, pharmacist, or health care provider.  2020 Elsevier/Gold Standard (2018-05-09 13:22:01)  

## 2019-12-28 NOTE — Progress Notes (Signed)
Amber Reed is a 39 y.o. year old [id number] Caucasian female who presents for placement of a Mirena IUD.  BP 119/63   Pulse 78   Ht 5\' 11"  (1.803 m)   Wt 173 lb 6 oz (78.6 kg)   LMP 12/23/2019 (Exact Date)   BMI 24.18 kg/m   Pregnancy test today was negative.   The risks and benefits of the method and placement have been thouroughly reviewed with the patient and all questions were answered.  Specifically the patient is aware of failure rate of 07/998, expulsion of the IUD and of possible perforation.  The patient is aware of irregular bleeding due to the method and understands the incidence of irregular bleeding diminishes with time.  Signed copy of informed consent in chart.   Time out was performed.  A medium plastic speculum was placed in the vagina.  The cervix was visualized, prepped using Betadine, and grasped with a single tooth tenaculum. The uterus was sounded to 9 cm.  Mirena IUD placed per manufacturer's recommendations.   The strings were trimmed to 3 cm.  The patient was given post procedure instructions, including signs and symptoms of infection and to check for the strings after each menses or each month, and refraining from intercourse or anything in the vagina for 3 days.  She was given a Mirena care card with date Mirena placed, and date Mirena to be removed.  Reviewed red flag symptoms and when to call.   RTC x 4-8 weeks for IUD string check or sooner if needed.   12/25/2019, CNM [facility], [facility] 12/28/19 4:56 PM    NDC: 12/30/19 Lot [id number] Exp: 04/2022

## 2020-01-21 NOTE — Telephone Encounter (Signed)
Please add patient to schedule on Thursday for ultrasound and visit with me to verify IUD placement. Cancel appointment scheduled for Friday. Thanks, JML

## 2020-01-21 NOTE — Telephone Encounter (Signed)
There is no work in spot? JML

## 2020-01-21 NOTE — Telephone Encounter (Signed)
Can you call patient and offer that option or Wed/Fri. Thanks, JML

## 2020-01-22 ENCOUNTER — Other Ambulatory Visit: Payer: Self-pay

## 2020-01-22 ENCOUNTER — Other Ambulatory Visit (INDEPENDENT_AMBULATORY_CARE_PROVIDER_SITE_OTHER): Payer: 59

## 2020-01-22 ENCOUNTER — Other Ambulatory Visit: Payer: Self-pay | Admitting: Certified Nurse Midwife

## 2020-01-22 DIAGNOSIS — R102 Pelvic and perineal pain: Secondary | ICD-10-CM

## 2020-01-22 DIAGNOSIS — Z30431 Encounter for routine checking of intrauterine contraceptive device: Secondary | ICD-10-CM

## 2020-01-22 DIAGNOSIS — N939 Abnormal uterine and vaginal bleeding, unspecified: Secondary | ICD-10-CM

## 2020-01-22 DIAGNOSIS — Z975 Presence of (intrauterine) contraceptive device: Secondary | ICD-10-CM

## 2020-01-23 ENCOUNTER — Other Ambulatory Visit: Payer: 59

## 2020-01-25 ENCOUNTER — Encounter: Payer: 59 | Admitting: Certified Nurse Midwife

## 2020-01-28 ENCOUNTER — Encounter: Payer: Self-pay | Admitting: Certified Nurse Midwife

## 2020-01-28 ENCOUNTER — Other Ambulatory Visit: Payer: Self-pay

## 2020-01-28 ENCOUNTER — Ambulatory Visit: Payer: 59 | Admitting: Certified Nurse Midwife

## 2020-01-28 VITALS — BP 101/64 | HR 78 | Ht 71.0 in | Wt 175.2 lb

## 2020-01-28 DIAGNOSIS — Z975 Presence of (intrauterine) contraceptive device: Secondary | ICD-10-CM | POA: Diagnosis not present

## 2020-01-28 DIAGNOSIS — N921 Excessive and frequent menstruation with irregular cycle: Secondary | ICD-10-CM

## 2020-01-28 NOTE — Patient Instructions (Addendum)
Levonorgestrel intrauterine device (IUD) What is this medicine? LEVONORGESTREL IUD (LEE voe nor jes trel) is a contraceptive (birth control) device. The device is placed inside the uterus by a healthcare professional. It is used to prevent pregnancy. This device can also be used to treat heavy bleeding that occurs during your period. This medicine may be used for other purposes; ask your health care provider or pharmacist if you have questions. COMMON BRAND NAME(S): Kyleena, LILETTA, Mirena, Skyla What should I tell my health care provider before I take this medicine? They need to know if you have any of these conditions:  abnormal Pap smear  cancer of the breast, uterus, or cervix  diabetes  endometritis  genital or pelvic infection now or in the past  have more than one sexual partner or your partner has more than one partner  heart disease  history of an ectopic or tubal pregnancy  immune system problems  IUD in place  liver disease or tumor  problems with blood clots or take blood-thinners  seizures  use intravenous drugs  uterus of unusual shape  vaginal bleeding that has not been explained  an unusual or allergic reaction to levonorgestrel, other hormones, silicone, or polyethylene, medicines, foods, dyes, or preservatives  pregnant or trying to get pregnant  breast-feeding How should I use this medicine? This device is placed inside the uterus by a health care professional. Talk to your pediatrician regarding the use of this medicine in children. Special care may be needed. Overdosage: If you think you have taken too much of this medicine contact a poison control center or emergency room at once. NOTE: This medicine is only for you. Do not share this medicine with others. What if I miss a dose? This does not apply. Depending on the brand of device you have inserted, the device will need to be replaced every 3 to 6 years if you wish to continue using this type  of birth control. What may interact with this medicine? Do not take this medicine with any of the following medications:  amprenavir  bosentan  fosamprenavir This medicine may also interact with the following medications:  aprepitant  armodafinil  barbiturate medicines for inducing sleep or treating seizures  bexarotene  boceprevir  griseofulvin  medicines to treat seizures like carbamazepine, ethotoin, felbamate, oxcarbazepine, phenytoin, topiramate  modafinil  pioglitazone  rifabutin  rifampin  rifapentine  some medicines to treat HIV infection like atazanavir, efavirenz, indinavir, lopinavir, nelfinavir, tipranavir, ritonavir  St. John's wort  warfarin This list may not describe all possible interactions. Give your health care provider a list of all the medicines, herbs, non-prescription drugs, or dietary supplements you use. Also tell them if you smoke, drink alcohol, or use illegal drugs. Some items may interact with your medicine. What should I watch for while using this medicine? Visit your doctor or health care professional for regular check ups. See your doctor if you or your partner has sexual contact with others, becomes HIV positive, or gets a sexual transmitted disease. This product does not protect you against HIV infection (AIDS) or other sexually transmitted diseases. You can check the placement of the IUD yourself by reaching up to the top of your vagina with clean fingers to feel the threads. Do not pull on the threads. It is a good habit to check placement after each menstrual period. Call your doctor right away if you feel more of the IUD than just the threads or if you cannot feel the threads at   all. The IUD may come out by itself. You may become pregnant if the device comes out. If you notice that the IUD has come out use a backup birth control method like condoms and call your health care provider. Using tampons will not change the position of the  IUD and are okay to use during your period. This IUD can be safely scanned with magnetic resonance imaging (MRI) only under specific conditions. Before you have an MRI, tell your healthcare provider that you have an IUD in place, and which type of IUD you have in place. What side effects may I notice from receiving this medicine? Side effects that you should report to your doctor or health care professional as soon as possible:  allergic reactions like skin rash, itching or hives, swelling of the face, lips, or tongue  fever, flu-like symptoms  genital sores  high blood pressure  no menstrual period for 6 weeks during use  pain, swelling, warmth in the leg  pelvic pain or tenderness  severe or sudden headache  signs of pregnancy  stomach cramping  sudden shortness of breath  trouble with balance, talking, or walking  unusual vaginal bleeding, discharge  yellowing of the eyes or skin Side effects that usually do not require medical attention (report to your doctor or health care professional if they continue or are bothersome):  acne  breast pain  change in sex drive or performance  changes in weight  cramping, dizziness, or faintness while the device is being inserted  headache  irregular menstrual bleeding within first 3 to 6 months of use  nausea This list may not describe all possible side effects. Call your doctor for medical advice about side effects. You may report side effects to FDA at 1-800-FDA-1088. Where should I keep my medicine? This does not apply. NOTE: This sheet is a summary. It may not cover all possible information. If you have questions about this medicine, talk to your doctor, pharmacist, or health care provider.  2020 Elsevier/Gold Standard (2018-05-09 13:22:01)  Hysterectomy Information  A hysterectomy is a surgery to remove your uterus. After surgery, you will no longer have periods. Also, you will no longer be able to get  pregnant. Reasons for this surgery You may have this surgery if:  You have bleeding in your vagina: ? That is not normal. ? That does not stop, or that keeps coming back.  You have long-term (chronic) pain in your lower belly (pelvic area).  The lining of your uterus grows outside of the uterus (endometriosis).  The lining of your uterus grows in the muscle of the uterus (adenomyosis).  Your uterus falls down into your vagina (prolapse).  You have a growth in your uterus that causes problems (uterine fibroids).  You have cells that could turn into cancer (precancerous cells).  You have cancer of the uterus or cervix. Types of hysterectomies There are 3 types of hysterectomies. Depending on the type, the surgery will:  Remove the top part of the uterus (supracervical).  Remove the uterus and the cervix (total).  Remove the uterus, cervix, and tissue that holds the uterus in place (radical). Ways a hysterectomy can be done This surgery may be done in one of these ways:  A cut (incision) is made in the belly (abdomen). The uterus is taken out through the cut.  A cut is made in the vagina. The uterus is taken out through the cut.  Three or four cuts are made in the belly. A  device with a camera is put through one of the cuts. The uterus is cut into pieces and taken out through the cuts or the vagina.  Three or four cuts are made in the belly. A device with a camera is put through one of the cuts. The uterus is taken out through the vagina.  Three or four cuts are made in the belly. A computer helps control the surgical tools. The uterus is cut into small pieces. The pieces are taken out through the cuts or through the vagina. Talk with your doctor about which way is best for you. Risks of hysterectomy Generally, this surgery is safe. However, problems can happen, including:  Bleeding.  Needing donated blood (transfusion).  Blood clots.  Infection.  Damage to other  structures or organs.  Allergic reactions.  Needing to switch to a different type of surgery. What to expect after surgery  You will be given pain medicine.  You will need to stay in the hospital for 1-2 days.  Follow your doctor's instructions about: ? Exercising. ? Driving. ? What activities are safe for you.  You will need to have someone with you at home for 3-5 days.  You will need to see your doctor after 2-4 weeks.  You may get hot flashes, have night sweats, and have trouble sleeping.  You may need to have Pap tests if your surgery was related to cancer. Talk with your doctor about how often you need Pap tests. Questions to ask your doctor  Do I need this surgery? Do I have other treatment options?  What are my options for this surgery?  What needs to be removed?  What are the risks?  What are the benefits?  How long will I need to stay in the hospital?  How long will I need to recover?  What symptoms can I expect after the procedure? Summary  A hysterectomy is a surgery to remove your uterus. After surgery, you will no longer have periods. Also, you will no longer be able to get pregnant.  Talk with your doctor about which type of hysterectomy is best for you. This information is not intended to replace advice given to you by your health care provider. Make sure you discuss any questions you have with your health care provider. Document Revised: 08/31/2018 Document Reviewed: 09/28/2016 Elsevier Patient Education  2020 Elsevier Inc.   Endometrial Ablation Endometrial ablation is a procedure that destroys the thin inner layer of the lining of the uterus (endometrium). This procedure may be done:  To stop heavy periods.  To stop bleeding that is causing anemia.  To control irregular bleeding.  To treat bleeding caused by small tumors (fibroids) in the endometrium. This procedure is often an alternative to major surgery, such as removal of the uterus  and cervix (hysterectomy). As a result of this procedure:  You may not be able to have children. However, if you are premenopausal (you have not gone through menopause): ? You may still have a small chance of getting pregnant. ? You will need to use a reliable method of birth control after the procedure to prevent pregnancy.  You may stop having a menstrual period, or you may have only a small amount of bleeding during your period. Menstruation may return several years after the procedure. Tell a health care provider about:  Any allergies you have.  All medicines you are taking, including vitamins, herbs, eye drops, creams, and over-the-counter medicines.  Any problems you or family  members have had with the use of anesthetic medicines.  Any blood disorders you have.  Any surgeries you have had.  Any medical conditions you have. What are the risks? Generally, this is a safe procedure. However, problems may occur, including:  A hole (perforation) in the uterus or bowel.  Infection of the uterus, bladder, or vagina.  Bleeding.  Damage to other structures or organs.  An air bubble in the lung (air embolus).  Problems with pregnancy after the procedure.  Failure of the procedure.  Decreased ability to diagnose cancer in the endometrium. What happens before the procedure?  You will have tests of your endometrium to make sure there are no pre-cancerous cells or cancer cells present.  You may have an ultrasound of the uterus.  You may be given medicines to thin the endometrium.  Ask your health care provider about: ? Changing or stopping your regular medicines. This is especially important if you take diabetes medicines or blood thinners. ? Taking medicines such as aspirin and ibuprofen. These medicines can thin your blood. Do not take these medicines before your procedure if your doctor tells you not to.  Plan to have someone take you home from the hospital or  clinic. What happens during the procedure?   You will lie on an exam table with your feet and legs supported as in a pelvic exam.  To lower your risk of infection: ? Your health care team will wash or sanitize their hands and put on germ-free (sterile) gloves. ? Your genital area will be washed with soap.  An IV tube will be inserted into one of your veins.  You will be given a medicine to help you relax (sedative).  A surgical instrument with a light and camera (resectoscope) will be inserted into your vagina and moved into your uterus. This allows your surgeon to see inside your uterus.  Endometrial tissue will be removed using one of the following methods: ? Radiofrequency. This method uses a radiofrequency-alternating electric current to remove the endometrium. ? Cryotherapy. This method uses extreme cold to freeze the endometrium. ? Heated-free liquid. This method uses a heated saltwater (saline) solution to remove the endometrium. ? Microwave. This method uses high-energy microwaves to heat up the endometrium and remove it. ? Thermal balloon. This method involves inserting a catheter with a balloon tip into the uterus. The balloon tip is filled with heated fluid to remove the endometrium. The procedure may vary among health care providers and hospitals. What happens after the procedure?  Your blood pressure, heart rate, breathing rate, and blood oxygen level will be monitored until the medicines you were given have worn off.  As tissue healing occurs, you may notice vaginal bleeding for 4-6 weeks after the procedure. You may also experience: ? Cramps. ? Thin, watery vaginal discharge that is light pink or brown in color. ? A need to urinate more frequently than usual. ? Nausea.  Do not drive for 24 hours if you were given a sedative.  Do not have sex or insert anything into your vagina until your health care provider approves. Summary  Endometrial ablation is done to treat  the many causes of heavy menstrual bleeding.  The procedure may be done only after medications have been tried to control the bleeding.  Plan to have someone take you home from the hospital or clinic. This information is not intended to replace advice given to you by your health care provider. Make sure you discuss any questions you  have with your health care provider. Document Revised: 12/13/2017 Document Reviewed: 07/15/2016 Elsevier Patient Education  2020 [employer]

## 2020-01-28 NOTE — Progress Notes (Signed)
Patient comes in today for IUD string check. Patient has not stopped bleeding. She has had cramping since IUD was placed.

## 2020-01-28 NOTE — Progress Notes (Signed)
GYN ENCOUNTER NOTE  Subjective:       Amber Reed is a 39 y.o. G14P1001 female is here for gynecologic evaluation of the following issues:  1. Breakthrough bleeding with IUD-reports bleeding and cramping since placement on 12/28/2019; notes that these symptoms are different from previous insertions  Denies difficulty breathing or respiratory distress, chest pain, dysuria, and leg pain or swelling.    Gynecologic History  No LMP recorded. (Menstrual status: IUD).  Contraception: IUD  Last Pap: 2017. Results were: Neg/Neg  Obstetric History  OB History  Gravida Para Term Preterm AB Living  1 1 1     1   SAB TAB Ectopic Multiple Live Births          1    # Outcome Date GA Lbr Len/2nd Weight Sex Delivery Anes PTL Lv  1 Term 02/15/05   10 lb (4.536 kg) M CS-LTranv Spinal N LIV    Past Medical History:  Diagnosis Date  . Anxiety   . Endometriosis   . Factor 5 Leiden mutation, heterozygous (HCC)   . Meniere disease   . Migraine   . PCO (polycystic ovaries)   . Shingles outbreak 09/2018    Past Surgical History:  Procedure Laterality Date  . CESAREAN SECTION    . NASAL SINUS SURGERY    . PELVIC LAPAROSCOPY    . SMALL INTESTINE SURGERY      Current Outpatient Medications on File Prior to Visit  Medication Sig Dispense Refill  . AJOVY 225 MG/1.5ML SOSY Inject into the skin.    . clonazePAM (KLONOPIN) 0.5 MG tablet Take 0.5 mg by mouth daily as needed.    . Cyanocobalamin-Methylcobalamin 600-600 MCG SUBL Place under the tongue.    . hydrochlorothiazide (HYDRODIURIL) 25 MG tablet Take 25 mg by mouth daily.    . meclizine (ANTIVERT) 25 MG tablet Take 1 tablet by mouth as needed.    . ondansetron (ZOFRAN) 4 MG tablet Take 4-8 mg by mouth every 6 (six) hours as needed.    . ondansetron (ZOFRAN-ODT) 8 MG disintegrating tablet     . potassium chloride SA (K-DUR,KLOR-CON) 20 MEQ tablet Take 20 mEq by mouth daily.    10/2018 UBRELVY 100 MG TABS Take by mouth.     No current  facility-administered medications on file prior to visit.    Allergies  Allergen Reactions  . Other     Migraine medications  . Triptans   . Amoxicillin Other (See Comments)    Scratchy throat    Social History   Socioeconomic History  . Marital status: Married    Spouse name: Not on file  . Number of children: Not on file  . Years of education: Not on file  . Highest education level: Not on file  Occupational History  . Not on file  Tobacco Use  . Smoking status: Never Smoker  . Smokeless tobacco: Never Used  Vaping Use  . Vaping Use: Never used  Substance and Sexual Activity  . Alcohol use: Yes    Comment: ocassional   . Drug use: Not Currently  . Sexual activity: Yes    Birth control/protection: I.U.D.  Other Topics Concern  . Not on file  Social History Narrative  . Not on file   Social Determinants of Health   Financial Resource Strain:   . Difficulty of Paying Living Expenses:   Food Insecurity:   . Worried About Marland Kitchen in the Last Year:   . Programme researcher, broadcasting/film/video of  Food in the Last Year:   Transportation Needs:   . Freight forwarder (Medical):   Marland Kitchen Lack of Transportation (Non-Medical):   Physical Activity:   . Days of Exercise per Week:   . Minutes of Exercise per Session:   Stress:   . Feeling of Stress :   Social Connections:   . Frequency of Communication with Friends and Family:   . Frequency of Social Gatherings with Friends and Family:   . Attends Religious Services:   . Active Member of Clubs or Organizations:   . Attends Banker Meetings:   Marland Kitchen Marital Status:   Intimate Partner Violence:   . Fear of Current or Ex-Partner:   . Emotionally Abused:   Marland Kitchen Physically Abused:   . Sexually Abused:     Family History  Problem Relation Age of Onset  . Diabetes Mother   . Seizures Paternal Aunt   . Cancer Maternal Grandmother   . Stroke Paternal Grandmother   . Breast cancer Neg Hx   . Ovarian cancer Neg Hx   . Colon cancer  Neg Hx     The following portions of the patient's history were reviewed and updated as appropriate: allergies, current medications, past family history, past medical history, past social history, past surgical history and problem list.  Review of Systems  ROS negative except as noted above. Information obtained from patient.   Objective:   BP 101/64   Pulse 78   Ht 5\' 11"  (1.803 m)   Wt 175 lb 3.2 oz (79.5 kg)   BMI 24.44 kg/m    CONSTITUTIONAL: Well-developed, well-nourished female in no acute distress.   PELVIC:  External Genitalia: Normal  Vagina: Normal  Cervix: Normal, IUD string present   MUSCULOSKELETAL: Normal range of motion. No tenderness.  No cyanosis, clubbing, or edema.  ULTRASOUND REPORT   Location: [facility]  Date of Service: 01/22/2020    TECHNIQUE: Both transabdominal and transvaginal ultrasound examinations of the pelvis were performed. Transabdominal technique was performed for global imaging of the pelvis including uterus, ovaries, adnexal regions, and pelvic cul-de-sac. It was necessary to proceed with endovaginal exam following the transabdominal exam to visualize the endometrium and adnexa.   Indications:AUB /IUD check   Findings:  The uterus is anteverted and measures 7.5 x 3.8 x 4.8 cm. Echo texture is homogenous without evidence of focal masses.   The Endometrium measures 3 mm. IUD is properly located with-in the endometrium.   Right Ovary measures 3.5 x 2.6 x 2.7 cm. It is normal in appearance. Left Ovary measures 3.4 x 2.0 x 3.0 cm. It is normal in appearance. Survey of the adnexa demonstrates no adnexal masses. There is no free fluid in the cul de sac.   Impression: 1. IUD is seen with-in the endometrium.   Recommendations: 1.Clinical correlation with the patient's History and Physical Exam.      Assessment:   1. Breakthrough bleeding associated with intrauterine device (IUD)  Plan:   Ultrasound findings reviewed  with patient, verbalized understanding.   Patient considering hysterectomy for management of bleeding. Information provided, see AVS.   Reviewed red flag symptoms and when to call.   RTC as needed.    01/24/2020, CNM [facility], [facility]

## 2020-01-31 ENCOUNTER — Encounter: Payer: 59 | Admitting: Certified Nurse Midwife

## 2020-01-31 ENCOUNTER — Other Ambulatory Visit: Payer: 59

## 2020-03-12 IMAGING — MR MR LUMBAR SPINE W/O CM
5 series · 31 of 48 positions shown · non-contrast
Comparison: MRI of the lumbar spine December 23, 2017.

CLINICAL DATA: Low back pain with left leg weakness.

EXAM:
MRI LUMBAR SPINE WITHOUT CONTRAST
TECHNIQUE: Multiplanar, multisequence MR imaging of the lumbar spine was
performed. No intravenous contrast was administered.

[Series 5: T2 · sagittal · 4.0mm · 0.81mm/px · 7 of 15 slices shown (1 of 2)]
[im 1/15]
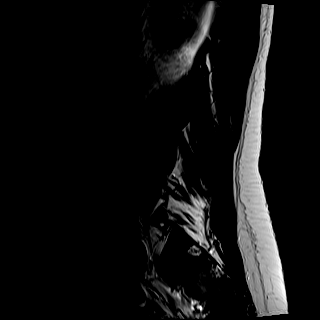
[im 3/15]
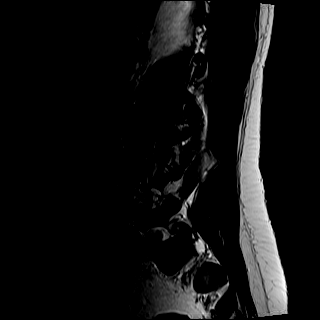
[im 5/15]
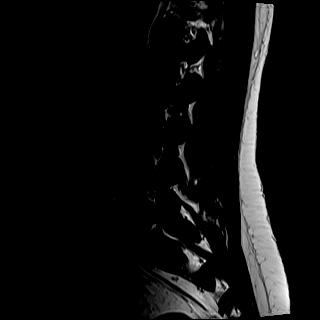
[im 8/15]
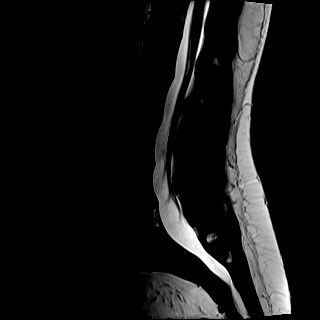
[im 10/15]
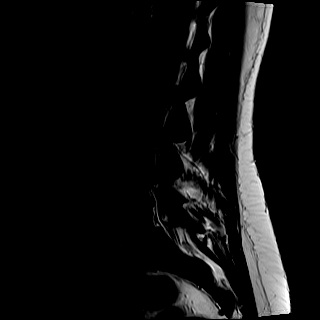
[im 12/15]
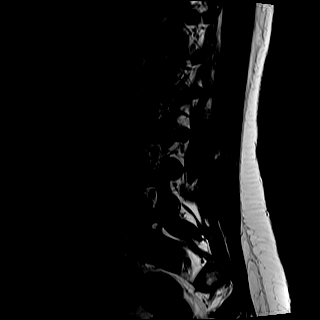
[im 15/15]
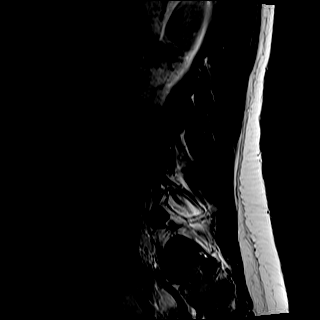

[Series 6: T1 · sagittal · 4.0mm · 0.81mm/px · 7 of 15 slices shown (1 of 2)]
[im 1/15]
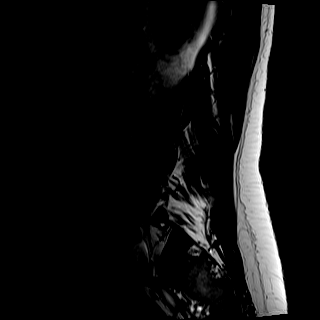
[im 3/15]
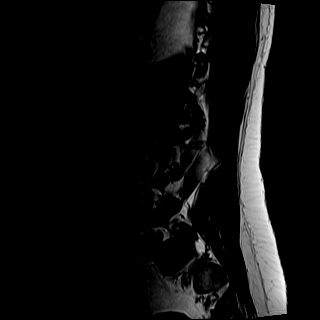
[im 5/15]
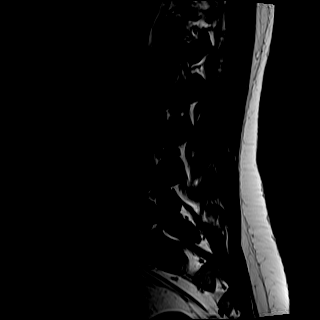
[im 8/15]
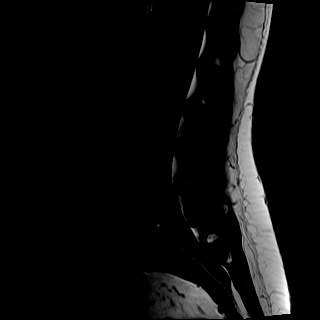
[im 10/15]
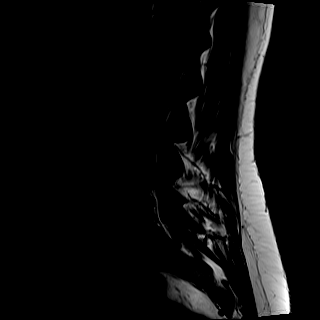
[im 12/15]
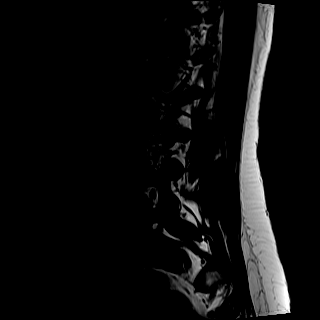
[im 15/15]
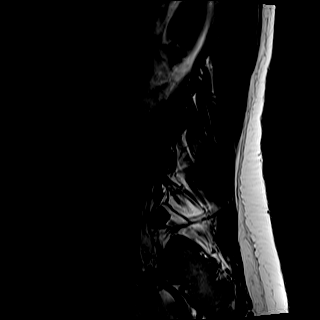

[Series 7: STIR · sagittal · 4.0mm · 0.41mm/px · 1 of 15 slices shown]
[im 1/15]
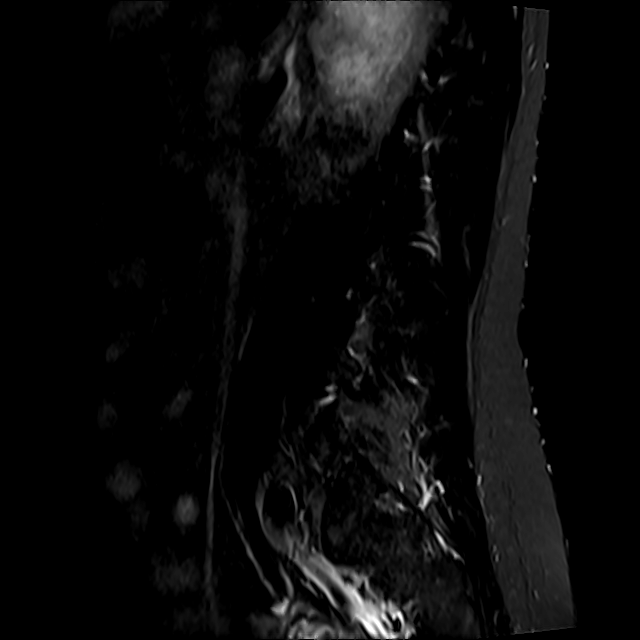

[Series 8: T2 · axial · 4.0mm · 0.78mm/px · z∈[-81,+125]mm · 8 of 34 slices shown (2 of 2)]
[im 1/34]
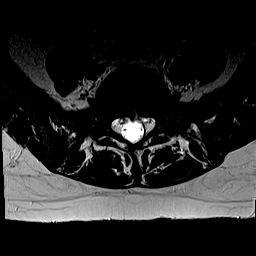
[im 6/34]
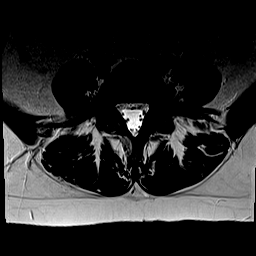
[im 11/34]
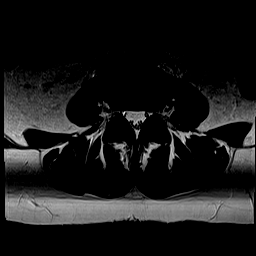
[im 16/34]
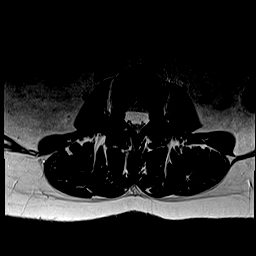
[im 18/34]
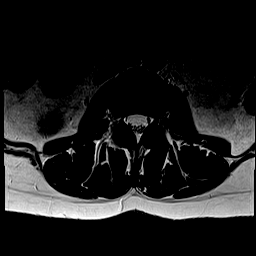
[im 23/34]
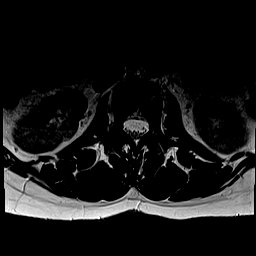
[im 28/34]
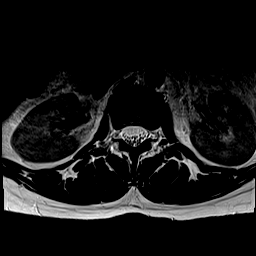
[im 34/34]
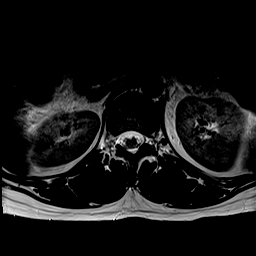

[Series 9: T1 · axial · 4.0mm · 0.39mm/px · z∈[-81,+125]mm · 8 of 34 slices shown (2 of 2)]
[im 1/34]
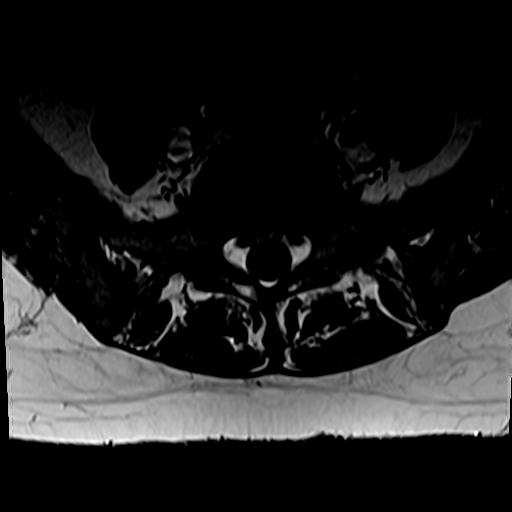
[im 6/34]
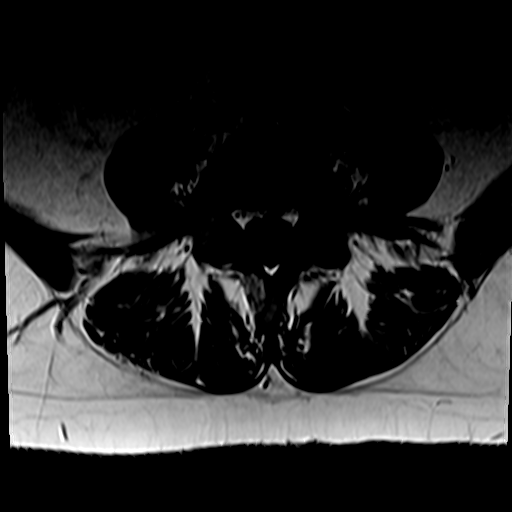
[im 11/34]
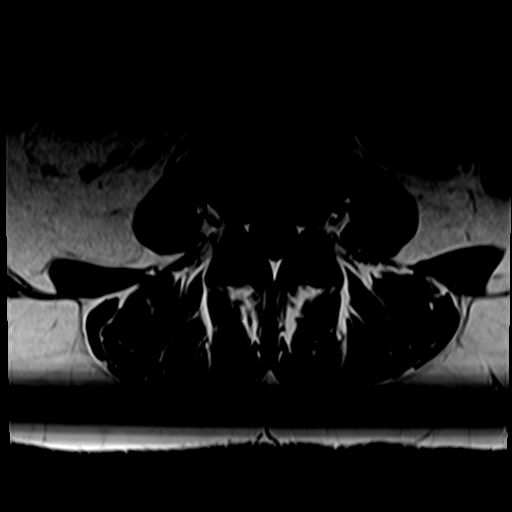
[im 16/34]
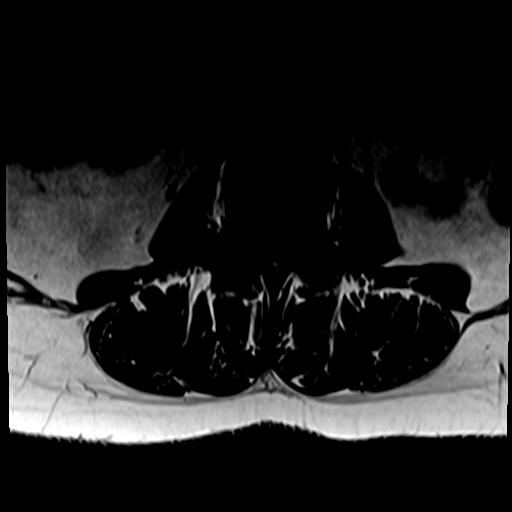
[im 18/34]
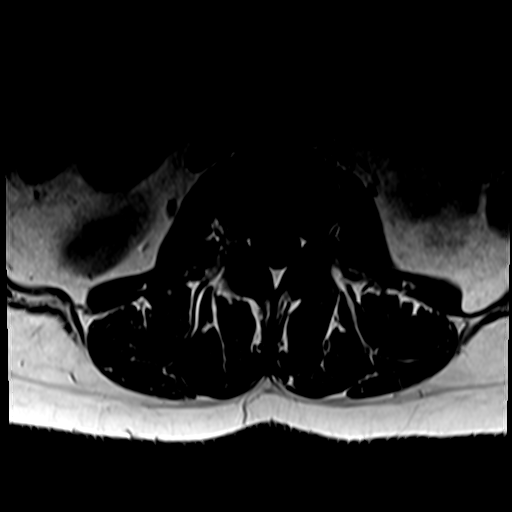
[im 23/34]
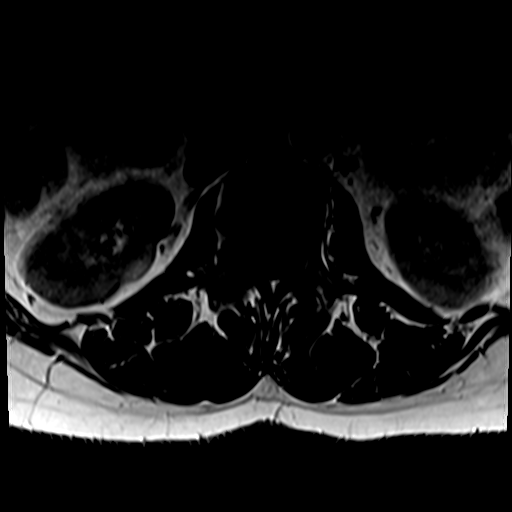
[im 28/34]
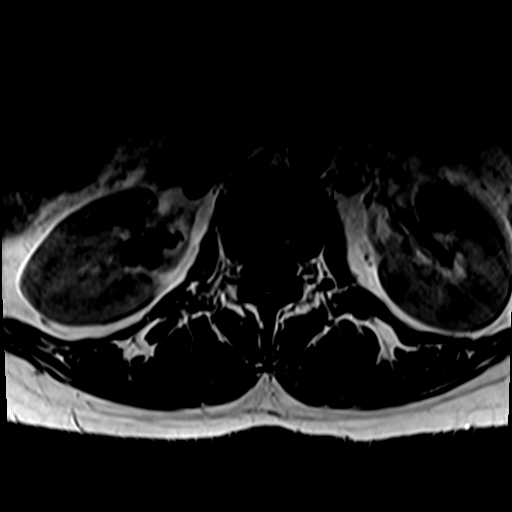
[im 34/34]
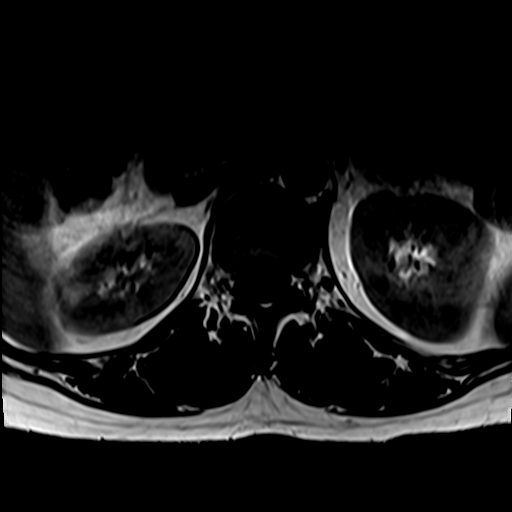

[31 of 48 positions shown; findings below may reference images not displayed]

FINDINGS: Segmentation:  Standard.

Alignment: Minimal anterolisthesis of L5 over S1 related to
bilateral pars defect.

Vertebrae: No fracture, evidence of discitis, or bone lesion.
Endplate degenerative changes at L4-5, progressed from prior MRI.

Conus medullaris and cauda equina: Conus extends to the L1 level.
Conus and cauda equina appear normal.

Paraspinal and other soft tissues: Negative.

Disc levels:

T12-L1: Mild left facet degenerative changes. No spinal canal or
neural foraminal stenosis.

L1-2: No spinal canal or neural foraminal stenosis.

L2-3: No spinal canal or neural foraminal stenosis.

L3-4: Shallow disc bulge. No significant spinal canal or neural
foraminal stenosis.

L4-5: Loss of disc height and symmetric disc bulge resulting in mild
bilateral neural foraminal narrowing. There is widening of the
spinal canal. The previously described left central/subarticular
disc protrusion is no longer seen.

L5-S1: Left asymmetric disc bulge/uncovering of the disc and annular
fissure causing mild narrowing of the left neural foramen and
abutting the left L5 nerve root. There is widening of the spinal
canal and spinal.
IMPRESSION: 1. Minimal anterolisthesis of L5 over S1 related to bilateral pars
defect.
2. Left asymmetric disc bulge/uncovering of the disc and annular
fissure at L5-S1 with mild narrowing of the left neural foramen,
abutting the left L5 nerve root.
3. Mild progression of endplate degenerative changes at L4-5;
resolution of the L4-5 disc protrusion with persistent left
asymmetric disc bulge at this level.

## 2020-07-12 HISTORY — PX: MASTOPEXY: SUR857

## 2020-08-18 ENCOUNTER — Other Ambulatory Visit
Admission: RE | Admit: 2020-08-18 | Discharge: 2020-08-18 | Disposition: A | Discharge status: Home / Self Care | Payer: 59 | Source: Ambulatory Visit | Attending: Infectious Diseases | Admitting: Infectious Diseases

## 2020-08-18 DIAGNOSIS — R079 Chest pain, unspecified: Secondary | ICD-10-CM | POA: Insufficient documentation

## 2020-08-18 LAB — TROPONIN I (HIGH SENSITIVITY): Troponin I (High Sensitivity): 2 ng/L (ref <18)

## 2020-08-18 LAB — FIBRIN DERIVATIVES D-DIMER (ARMC ONLY): Fibrin derivatives D-dimer (ARMC): 228.26 ng/mL (FEU) (ref 0.00–499.00)

## 2021-01-29 ENCOUNTER — Encounter: Payer: 59 | Admitting: Certified Nurse Midwife

## 2021-02-09 ENCOUNTER — Other Ambulatory Visit (HOSPITAL_COMMUNITY)
Admission: RE | Admit: 2021-02-09 | Discharge: 2021-02-09 | Disposition: A | Discharge status: Home / Self Care | Payer: 59 | Source: Ambulatory Visit | Attending: Certified Nurse Midwife | Admitting: Certified Nurse Midwife

## 2021-02-09 ENCOUNTER — Ambulatory Visit (INDEPENDENT_AMBULATORY_CARE_PROVIDER_SITE_OTHER): Payer: 59 | Admitting: Certified Nurse Midwife

## 2021-02-09 ENCOUNTER — Encounter: Payer: Self-pay | Admitting: Certified Nurse Midwife

## 2021-02-09 ENCOUNTER — Other Ambulatory Visit: Payer: Self-pay

## 2021-02-09 ENCOUNTER — Encounter: Payer: 59 | Admitting: Certified Nurse Midwife

## 2021-02-09 VITALS — BP 113/65 | HR 94 | Ht 71.0 in | Wt 171.2 lb

## 2021-02-09 DIAGNOSIS — R39198 Other difficulties with micturition: Secondary | ICD-10-CM

## 2021-02-09 DIAGNOSIS — Z01419 Encounter for gynecological examination (general) (routine) without abnormal findings: Secondary | ICD-10-CM | POA: Diagnosis present

## 2021-02-09 DIAGNOSIS — R32 Unspecified urinary incontinence: Secondary | ICD-10-CM | POA: Insufficient documentation

## 2021-02-09 DIAGNOSIS — Z124 Encounter for screening for malignant neoplasm of cervix: Secondary | ICD-10-CM

## 2021-02-09 DIAGNOSIS — Z1231 Encounter for screening mammogram for malignant neoplasm of breast: Secondary | ICD-10-CM | POA: Diagnosis not present

## 2021-02-09 DIAGNOSIS — R339 Retention of urine, unspecified: Secondary | ICD-10-CM

## 2021-02-09 NOTE — Progress Notes (Addendum)
GYNECOLOGY ANNUAL PREVENTATIVE CARE ENCOUNTER NOTE  History:     Amber Reed is a 40 y.o. G58P1001 female here for a routine annual gynecologic exam.  Current complaints: pt state she has difficult voiding, some times takes her five minutes to relax and empty all the way. She also notes that she has discomfort with intercourse, she has feeling the need to void "feels like I am going to pee myself". She denies abnormal vaginal bleeding, discharge, pelvic pain, or other gynecologic concerns.    Social Relationship: married  Living:with spouse and child Work:  Exercise: regularly  Smoke/Alcohol/drug use: denies use   Gynecologic History No LMP recorded. (Menstrual status: IUD). Contraception: IUD Last Pap: 2017. Results were: normal with negative HPV Last mammogram: has not had  The pregnancy intention screening data noted above was reviewed. Potential methods of contraception were discussed. The patient elected to proceed with IUd.   Obstetric History OB History  Gravida Para Term Preterm AB Living  1 1 1     1   SAB IAB Ectopic Multiple Live Births          1    # Outcome Date GA Lbr Len/2nd Weight Sex Delivery Anes PTL Lv  1 Term 02/15/05   10 lb (4.536 kg) M CS-LTranv Spinal N LIV    Past Medical History:  Diagnosis Date   Anxiety    Endometriosis    Factor 5 Leiden mutation, heterozygous (HCC)    Meniere disease    Migraine    PCO (polycystic ovaries)    Shingles outbreak 09/2018    Past Surgical History:  Procedure Laterality Date   CESAREAN SECTION     NASAL SINUS SURGERY     PELVIC LAPAROSCOPY     SMALL INTESTINE SURGERY      Current Outpatient Medications on File Prior to Visit  Medication Sig Dispense Refill   AJOVY 225 MG/1.5ML SOSY Inject into the skin.     clonazePAM (KLONOPIN) 0.5 MG tablet Take 0.5 mg by mouth daily as needed.     Cyanocobalamin-Methylcobalamin 600-600 MCG SUBL Place under the tongue.     hydrochlorothiazide (HYDRODIURIL)  25 MG tablet Take 25 mg by mouth daily.     linaCLOtide (LINZESS PO) Take by mouth.     meclizine (ANTIVERT) 25 MG tablet Take 1 tablet by mouth as needed.     ondansetron (ZOFRAN) 4 MG tablet Take 4-8 mg by mouth every 6 (six) hours as needed.     ondansetron (ZOFRAN-ODT) 8 MG disintegrating tablet      potassium chloride SA (K-DUR,KLOR-CON) 20 MEQ tablet Take 20 mEq by mouth daily.     UBRELVY 100 MG TABS Take by mouth.     No current facility-administered medications on file prior to visit.    Allergies  Allergen Reactions   Other     Migraine medications   Triptans    Amoxicillin Other (See Comments)    Scratchy throat    Social History:  reports that she has never smoked. She has never used smokeless tobacco. She reports current alcohol use. She reports previous drug use.  Family History  Problem Relation Age of Onset   Diabetes Mother    Seizures Paternal Aunt    Cancer Maternal Grandmother    Stroke Paternal Grandmother    Breast cancer Neg Hx    Ovarian cancer Neg Hx    Colon cancer Neg Hx     The following portions of the patient's history were  reviewed and updated as appropriate: allergies, current medications, past family history, past medical history, past social history, past surgical history and problem list.  Review of Systems Pertinent items noted in HPI and remainder of comprehensive ROS otherwise negative.  Physical Exam:  BP 113/65   Pulse 94   Ht 5\' 11"  (1.803 m)   Wt 171 lb 3.2 oz (77.7 kg)   BMI 23.88 kg/m  CONSTITUTIONAL: Well-developed, well-nourished female in no acute distress.  HENT:  Normocephalic, atraumatic, External right and left ear normal. Oropharynx is clear and moist EYES: Conjunctivae and EOM are normal. Pupils are equal, round, and reactive to light. No scleral icterus.  NECK: Normal range of motion, supple, no masses.  Normal thyroid.  SKIN: Skin is warm and dry. No rash noted. Not diaphoretic. No erythema. No  pallor. MUSCULOSKELETAL: Normal range of motion. No tenderness.  No cyanosis, clubbing, or edema.  2+ distal pulses. NEUROLOGIC: Alert and oriented to person, place, and time. Normal reflexes, muscle tone coordination.  PSYCHIATRIC: Normal mood and affect. Normal behavior. Normal judgment and thought content. CARDIOVASCULAR: Normal heart rate noted, regular rhythm RESPIRATORY: Clear to auscultation bilaterally. Effort and breath sounds normal, no problems with respiration noted. BREASTS: Symmetric in size. No masses, tenderness, skin changes, nipple drainage, or lymphadenopathy bilaterally.  Implants bilaterally  ABDOMEN: Soft, no distention noted.  No tenderness, rebound or guarding.  PELVIC: Normal appearing external genitalia and urethral meatus; normal appearing vaginal mucosa and cervix.  No abnormal discharge noted.  Pap smear obtained.  Normal uterine size, no other palpable masses, no uterine or adnexal tenderness.  .   Assessment and Plan:    Annual [facility] Women GYN exam   Pap:Will follow up results of pap smear and manage accordingly. Mammogram : ordered Labs: none Refills: none Referral: pelvic floor therapy  Routine preventative health maintenance measures emphasized. Please refer to After Visit Summary for other counseling recommendations.      , CNM [facility],  [facility]

## 2021-02-13 LAB — CYTOLOGY - PAP
Comment: NEGATIVE
Diagnosis: NEGATIVE
High risk HPV: NEGATIVE

## 2021-02-18 ENCOUNTER — Encounter: Payer: Self-pay | Admitting: Obstetrics and Gynecology

## 2021-02-18 ENCOUNTER — Other Ambulatory Visit: Payer: Self-pay

## 2021-02-18 ENCOUNTER — Ambulatory Visit: Payer: 59 | Admitting: Obstetrics and Gynecology

## 2021-02-18 VITALS — BP 107/69 | HR 94 | Resp 16 | Ht 71.0 in | Wt 169.9 lb

## 2021-02-18 DIAGNOSIS — N809 Endometriosis, unspecified: Secondary | ICD-10-CM | POA: Diagnosis not present

## 2021-02-18 DIAGNOSIS — R102 Pelvic and perineal pain: Secondary | ICD-10-CM | POA: Diagnosis not present

## 2021-02-18 NOTE — Progress Notes (Signed)
HPI:      [name] is a 40 y.o. G1P1001 who LMP was No LMP recorded. (Menstrual status: IUD).  Subjective:   She presents today to discuss surgical options for endometriosis and pelvic pain.  She currently has an IUD in place which has mitigated some of the pain but not completely.  She says that she has unhappy with the hormones of the IUD causing systemic symptoms.  She would like to discontinue use of the IUD for this reason.  She is currently on "bioidentical hormone replacement" which involves mainly testosterone.  Her main symptom from the endometriosis is pelvic pain and significant pain with intercourse and bowel movements. She has a history of endometriosis as diagnosed by laparoscopy. She states that she has completed childbearing.  She is interested in definitive management. Of significant note, she has a history of factor V Leiden.    Hx: The following portions of the patient's history were reviewed and updated as appropriate:             She  has a past medical history of Anxiety, Endometriosis, Factor 5 Leiden mutation, heterozygous (HCC), Meniere disease, Migraine, PCO (polycystic ovaries), and Shingles outbreak (09/2018). She does not have any pertinent problems on file. She  has a past surgical history that includes Cesarean section; Nasal sinus surgery; Pelvic laparoscopy; and Small intestine surgery. Her family history includes Cancer in her maternal grandmother; Diabetes in her mother; Seizures in her paternal aunt; Stroke in her paternal grandmother. She  reports that she has never smoked. She has never used smokeless tobacco. She reports current alcohol use. She reports previous drug use. She has a current medication list which includes the following prescription(s): ajovy, bupropion, clonazepam, cyanocobalamin-methylcobalamin, hydrochlorothiazide, linaclotide, meclizine, multi-vitamin, ondansetron, ondansetron, potassium chloride sa, and ubrelvy. She is allergic  to other, triptans, and amoxicillin.       Review of Systems:  Review of Systems  Constitutional: Denied constitutional symptoms, night sweats, recent illness, fatigue, fever, insomnia and weight loss.  Eyes: Denied eye symptoms, eye pain, photophobia, vision change and visual disturbance.  Ears/Nose/Throat/Neck: Denied ear, nose, throat or neck symptoms, hearing loss, nasal discharge, sinus congestion and sore throat.  Cardiovascular: Denied cardiovascular symptoms, arrhythmia, chest pain/pressure, edema, exercise intolerance, orthopnea and palpitations.  Respiratory: Denied pulmonary symptoms, asthma, pleuritic pain, productive sputum, cough, dyspnea and wheezing.  Gastrointestinal: Denied, gastro-esophageal reflux, melena, nausea and vomiting.  Genitourinary: See HPI for additional information.  Musculoskeletal: Denied musculoskeletal symptoms, stiffness, swelling, muscle weakness and myalgia.  Dermatologic: Denied dermatology symptoms, rash and scar.  Neurologic: Denied neurology symptoms, dizziness, headache, neck pain and syncope.  Psychiatric: Denied psychiatric symptoms, anxiety and depression.  Endocrine: Denied endocrine symptoms including hot flashes and night sweats.   Meds:   Current Outpatient Medications on File Prior to Visit  Medication Sig Dispense Refill   AJOVY 225 MG/1.5ML SOSY Inject into the skin.     buPROPion (WELLBUTRIN XL) 300 MG 24 hr tablet Take by mouth.     clonazePAM (KLONOPIN) 0.5 MG tablet Take 0.5 mg by mouth daily as needed.     Cyanocobalamin-Methylcobalamin 600-600 MCG SUBL Place under the tongue.     hydrochlorothiazide (HYDRODIURIL) 25 MG tablet Take 25 mg by mouth daily.     linaCLOtide (LINZESS PO) Take by mouth.     meclizine (ANTIVERT) 25 MG tablet Take 1 tablet by mouth as needed.     Multiple Vitamin (MULTI-VITAMIN) tablet Take 1 tablet by mouth daily.  ondansetron (ZOFRAN) 4 MG tablet Take 4-8 mg by mouth every 6 (six) hours as needed.      ondansetron (ZOFRAN-ODT) 8 MG disintegrating tablet      potassium chloride SA (K-DUR,KLOR-CON) 20 MEQ tablet Take 20 mEq by mouth daily.     UBRELVY 100 MG TABS Take by mouth.     No current facility-administered medications on file prior to visit.      Objective:     Vitals:   02/18/21 0848  BP: 107/69  Pulse: 94  Resp: 16   Filed Weights   02/18/21 0848  Weight: 169 lb 14.4 oz (77.1 kg)                Assessment:    G1P1001 Patient Active Problem List   Diagnosis Date Noted   Urinary retention 02/09/2021   Urinary incontinence 02/09/2021   Shingles, Lumbar (Right) 09/20/2018   Acute pain of right lower extremity 09/20/2018   Acute lumbar radiculopathy (Right) (L2) 09/20/2018   Chronic pain of right lower extremity 09/11/2018   Lumbar spine instability 08/31/2018   Chronic lumbar radiculitis (S1) (Left) 08/23/2018   Neurogenic pain 08/23/2018   DDD (degenerative disc disease), lumbosacral 08/23/2018   Osteoarthritis of hip (Left) 08/08/2018   Abnormal MRI, lumbar spine (12/23/2017) 07/24/2018   Lumbar facet arthropathy 07/24/2018   Lumbar facet syndrome 07/24/2018   Grade 1 Anterolisthesis L5/S1 07/24/2018   Lumbar lateral recess stenosis 07/24/2018   Lumbar foraminal stenosis 07/24/2018   Lumbar nerve root impingement (L5) (Left) 07/24/2018   Lumbar pars defect (L5) (Bilateral) 07/24/2018   Lumbar L5 pars defect w/ spondylolisthesis 07/24/2018   Chronic musculoskeletal pain 07/24/2018   Chronic sacroiliac joint pain (Tertiary Area of Pain) (Right) 07/24/2018   Family history of factor V Leiden mutation 06/29/2018   GAD (generalized anxiety disorder) 06/29/2018   Lyme disease 06/29/2018   Major depression 06/29/2018   Spondylosis 06/29/2018   Vaginal infection 06/29/2018   Chronic groin pain (Primary Area of Pain) (Left) 06/29/2018   Chronic hip pain (Secondary Area of Pain) (Left) 06/29/2018   Chronic low back pain (Fourth Area of Pain) (Bilateral)  (L>R) w/ sciatica (Bilateral) 06/29/2018   Chronic lower extremity pain (Bilateral) (L>R) 06/29/2018   Chronic pain syndrome 06/29/2018   Pharmacologic therapy 06/29/2018   Disorder of skeletal system 06/29/2018   Problems influencing health status 06/29/2018   Endometriosis 01/10/2018   Meniere's disease of right ear 06/14/2017   Allergic rhinitis due to animal hair and dander 02/15/2017   Dysfunction of right eustachian tube 02/15/2017   Hearing loss, sensorineural, asymmetrical 02/15/2017   Vestibular dizziness 02/15/2017   Insomnia 06/17/2014   Snoring 06/17/2014   DDD (degenerative disc disease), cervical 05/21/2014   Migraines 05/21/2014   Spondylolisthesis at L5-S1 level 05/21/2014     1. Endometriosis determined by laparoscopy   2. Pelvic pain in female        Plan:            1.  We have discussed multiple options for her endometriosis and pelvic pain.  Definitive management LAVH BSO discussed.  LAVH with BSO discussed.  Induction of false menopause for a short period to burnout the endometriosis discussed.  Endometrial ablation for menorrhagia and dysmenorrhea discussed.  She seems primarily interested in LAVH BSO.  She is amenable to estrogen replacement after surgery. She thinks that she would like the surgery sometime "during the winter".  Orders No orders of the defined types were placed in this encounter.  No orders of the defined types were placed in this encounter.     F/U  No follow-ups on file. She will follow-up when she has decided upon type of surgery and timing of surgery.  I spent 24 minutes involved in the care of this patient preparing to see the patient by obtaining and reviewing her medical history (including labs, imaging tests and prior procedures), documenting clinical information in the electronic health record (EHR), counseling and coordinating care plans, writing and sending prescriptions, ordering tests or procedures and in direct  communicating with the patient and medical staff discussing pertinent items from her history and physical exam.  Elonda Husky, M.D. 02/18/2021 9:17 AM

## 2021-03-10 ENCOUNTER — Other Ambulatory Visit: Payer: Self-pay | Admitting: Infectious Diseases

## 2021-03-10 DIAGNOSIS — K5900 Constipation, unspecified: Secondary | ICD-10-CM

## 2021-03-10 DIAGNOSIS — R079 Chest pain, unspecified: Secondary | ICD-10-CM

## 2021-03-10 DIAGNOSIS — R140 Abdominal distension (gaseous): Secondary | ICD-10-CM

## 2021-03-19 ENCOUNTER — Ambulatory Visit: Payer: 59

## 2021-05-07 ENCOUNTER — Telehealth: Payer: Self-pay | Admitting: Obstetrics and Gynecology

## 2021-05-07 NOTE — Telephone Encounter (Signed)
Amber Reed called and cancelled her appointment and wanted me to inform Dr. Logan Bores that her insurance will not cover the surgery if it is performed at [facility] so she stated she will have to have the surgery performed at [facility].

## 2021-05-12 ENCOUNTER — Encounter: Payer: 59 | Admitting: Obstetrics and Gynecology

## 2021-06-11 HISTORY — PX: POSTERIOR LUMBAR FUSION: SHX6036

## 2021-06-16 ENCOUNTER — Encounter: Payer: 59 | Admitting: Obstetrics and Gynecology

## 2021-11-04 ENCOUNTER — Other Ambulatory Visit: Payer: Self-pay | Admitting: Neurosurgery

## 2021-11-04 DIAGNOSIS — Z981 Arthrodesis status: Secondary | ICD-10-CM

## 2021-11-22 ENCOUNTER — Ambulatory Visit
Admission: RE | Admit: 2021-11-22 | Discharge: 2021-11-22 | Disposition: A | Discharge status: Home / Self Care | Payer: 59 | Source: Ambulatory Visit | Attending: Neurosurgery | Admitting: Neurosurgery

## 2021-11-22 DIAGNOSIS — Z981 Arthrodesis status: Secondary | ICD-10-CM | POA: Diagnosis present

## 2021-11-22 MED ORDER — GADOBUTROL 1 MMOL/ML IV SOLN
7.5000 mL | Freq: Once | INTRAVENOUS | Status: AC | PRN
  Administered 2021-11-22: 7.5 mL via INTRAVENOUS

## 2022-02-02 ENCOUNTER — Other Ambulatory Visit: Payer: Self-pay | Admitting: Sports Medicine

## 2022-02-02 DIAGNOSIS — M25552 Pain in left hip: Secondary | ICD-10-CM

## 2022-02-02 DIAGNOSIS — M25852 Other specified joint disorders, left hip: Secondary | ICD-10-CM

## 2022-02-17 ENCOUNTER — Other Ambulatory Visit: Payer: Self-pay | Admitting: Infectious Diseases

## 2022-02-17 DIAGNOSIS — N3289 Other specified disorders of bladder: Secondary | ICD-10-CM

## 2022-02-19 ENCOUNTER — Ambulatory Visit
Admission: RE | Admit: 2022-02-19 | Discharge: 2022-02-19 | Disposition: A | Discharge status: Home / Self Care | Payer: 59 | Source: Ambulatory Visit | Attending: Sports Medicine | Admitting: Sports Medicine

## 2022-02-19 DIAGNOSIS — M25552 Pain in left hip: Secondary | ICD-10-CM

## 2022-02-19 DIAGNOSIS — M25852 Other specified joint disorders, left hip: Secondary | ICD-10-CM | POA: Insufficient documentation

## 2022-02-19 MED ORDER — IOHEXOL 180 MG/ML  SOLN
20.0000 mL | Freq: Once | INTRAMUSCULAR | Status: AC | PRN
  Administered 2022-02-19: 15 mL

## 2022-02-19 MED ORDER — SODIUM CHLORIDE (PF) 0.9 % IJ SOLN
20.0000 mL | INTRAMUSCULAR | Status: DC | PRN
  Administered 2022-02-19: 5 mL

## 2022-02-19 MED ORDER — LIDOCAINE HCL (PF) 1 % IJ SOLN
10.0000 mL | Freq: Once | INTRAMUSCULAR | Status: AC
  Administered 2022-02-19: 8 mL via INTRADERMAL

## 2022-02-23 ENCOUNTER — Ambulatory Visit
Admission: RE | Admit: 2022-02-23 | Discharge: 2022-02-23 | Disposition: A | Discharge status: Home / Self Care | Payer: 59 | Source: Ambulatory Visit | Attending: Infectious Diseases | Admitting: Infectious Diseases

## 2022-02-23 DIAGNOSIS — N3289 Other specified disorders of bladder: Secondary | ICD-10-CM

## 2022-03-10 ENCOUNTER — Other Ambulatory Visit: Payer: Self-pay | Admitting: Orthopedic Surgery

## 2022-03-10 ENCOUNTER — Ambulatory Visit
Admission: RE | Admit: 2022-03-10 | Discharge: 2022-03-10 | Disposition: A | Discharge status: Home / Self Care | Payer: 59 | Source: Ambulatory Visit | Attending: Orthopedic Surgery | Admitting: Orthopedic Surgery

## 2022-03-10 DIAGNOSIS — M76892 Other specified enthesopathies of left lower limb, excluding foot: Secondary | ICD-10-CM

## 2022-03-12 ENCOUNTER — Other Ambulatory Visit: Payer: 59

## 2022-03-18 ENCOUNTER — Encounter
Admission: RE | Admit: 2022-03-18 | Discharge: 2022-03-18 | Disposition: A | Discharge status: Home / Self Care | Payer: 59 | Source: Ambulatory Visit | Attending: Orthopedic Surgery | Admitting: Orthopedic Surgery

## 2022-03-18 ENCOUNTER — Encounter: Payer: Self-pay | Admitting: Urgent Care

## 2022-03-18 VITALS — Ht 71.0 in | Wt 150.0 lb

## 2022-03-18 DIAGNOSIS — Z01818 Encounter for other preprocedural examination: Secondary | ICD-10-CM | POA: Insufficient documentation

## 2022-03-18 DIAGNOSIS — I471 Supraventricular tachycardia: Secondary | ICD-10-CM | POA: Diagnosis not present

## 2022-03-18 DIAGNOSIS — Z01812 Encounter for preprocedural laboratory examination: Secondary | ICD-10-CM

## 2022-03-18 HISTORY — DX: Other specified postprocedural states: Z98.890

## 2022-03-18 HISTORY — DX: Anemia, unspecified: D64.9

## 2022-03-18 HISTORY — DX: Spondylosis, unspecified: M47.9

## 2022-03-18 HISTORY — DX: Unilateral primary osteoarthritis, left hip: M16.12

## 2022-03-18 HISTORY — DX: Thyroiditis, unspecified: E06.9

## 2022-03-18 HISTORY — DX: Other specified postprocedural states: R11.2

## 2022-03-18 LAB — POTASSIUM: Potassium: 4.2 mmol/L (ref 3.5–5.1)

## 2022-03-18 NOTE — Patient Instructions (Addendum)
Your procedure is scheduled on: Monday, September 11 Report to the Registration Desk on the 1st floor of the [facility] To find out your arrival time, please call [phone] between 1PM - 3PM on: Friday, September 8 If your arrival time is 6:00 am, do not arrive prior to that time as the [facility] entrance doors do not open until 6:00 am.  REMEMBER: Instructions that are not followed completely may result in serious medical risk, up to and including death; or upon the discretion of your surgeon and anesthesiologist your surgery may need to be rescheduled.  Do not eat food after midnight the night before surgery.  No gum chewing, lozengers or hard candies.  You may however, drink CLEAR liquids up to 2 hours before you are scheduled to arrive for your surgery. Do not drink anything within 2 hours of your scheduled arrival time.  Clear liquids include: - water  - apple juice without pulp - gatorade (not RED colors) - black coffee or tea (Do NOT add milk or creamers to the coffee or tea) Do NOT drink anything that is not on this list.  In addition, your doctor has ordered for you to drink the provided  Ensure Pre-Surgery Clear Carbohydrate Drink  Drinking this carbohydrate drink up to two hours before surgery helps to reduce insulin resistance and improve patient outcomes. Please complete drinking 2 hours prior to scheduled arrival time.  TAKE THESE MEDICATIONS THE MORNING OF SURGERY WITH A SIP OF WATER:  Clonazepam if needed for anxiety  One week prior to surgery: starting today, September 7 Stop Anti-inflammatories (NSAIDS) such as Advil, Aleve, Ibuprofen, Motrin, Naproxen, Naprosyn and Aspirin based products such as Excedrin, Goodys Powder, BC Powder. Stop ANY OVER THE COUNTER supplements until after surgery. Stop multiple vitamins You may however, continue to take Tylenol if needed for pain up until the day of surgery.  No Alcohol for 24 hours before or after surgery.  No  Smoking including e-cigarettes for 24 hours prior to surgery.  No chewable tobacco products for at least 6 hours prior to surgery.  No nicotine patches on the day of surgery.  Do not use any "recreational" drugs for at least a week prior to your surgery.  Please be advised that the combination of cocaine and anesthesia may have negative outcomes, up to and including death. If you test positive for cocaine, your surgery will be cancelled.  On the morning of surgery brush your teeth with toothpaste and water, you may rinse your mouth with mouthwash if you wish. Do not swallow any toothpaste or mouthwash.  Use CHG Soap as directed on instruction sheet.  Do not wear jewelry, make-up, hairpins, clips or nail polish.  Do not wear lotions, powders, or perfumes.   Do not shave body from the neck down 48 hours prior to surgery just in case you cut yourself which could leave a site for infection.  Also, freshly shaved skin may become irritated if using the CHG soap.  Contact lenses, hearing aids and dentures may not be worn into surgery.  Do not bring valuables to the hospital. [facility] is not responsible for any missing/lost belongings or valuables.   Notify your doctor if there is any change in your medical condition (cold, fever, infection).  Wear comfortable clothing (specific to your surgery type) to the hospital.  After surgery, you can help prevent lung complications by doing breathing exercises.  Take deep breaths and cough every 1-2 hours. Your doctor may order  a device called an Incentive Spirometer to help you take deep breaths.  If you are being discharged the day of surgery, you will not be allowed to drive home. You will need a responsible adult (18 years or older) to drive you home and stay with you that night.   If you are taking public transportation, you will need to have a responsible adult (18 years or older) with you. Please confirm with your physician that it is  acceptable to use public transportation.   Please call the Pre-admissions Testing Dept. at [phone] if you have any questions about these instructions.  Surgery Visitation Policy:  Patients undergoing a surgery or procedure may have two family members or support persons with them as long as the person is not COVID-19 positive or experiencing its symptoms.      Preparing for Surgery with CHLORHEXIDINE GLUCONATE (CHG) Soap  Before surgery, you can play an important role by reducing the number of germs on your skin.  CHG (Chlorhexidine gluconate) soap is an antiseptic cleanser which kills germs and bonds with the skin to continue killing germs even after washing.  Please do not use if you have an allergy to CHG or antibacterial soaps. If your skin becomes reddened/irritated stop using the CHG.  1. Shower the NIGHT BEFORE SURGERY and the MORNING OF SURGERY with CHG soap.  2. If you choose to wash your hair, wash your hair first as usual with your normal shampoo.  3. After shampooing, rinse your hair and body thoroughly to remove the shampoo.  4. Use CHG as you would any other liquid soap. You can apply CHG directly to the skin and wash gently with a scrungie or a clean washcloth.  5. Apply the CHG soap to your body only from the neck down. Do not use on open wounds or open sores. Avoid contact with your eyes, ears, mouth, and genitals (private parts). Wash face and genitals (private parts) with your normal soap.  6. Wash thoroughly, paying special attention to the area where your surgery will be performed.  7. Thoroughly rinse your body with warm water.  8. Do not shower/wash with your normal soap after using and rinsing off the CHG soap.  9. Pat yourself dry with a clean towel.  10. Wear clean pajamas to bed the night before surgery.  12. Place clean sheets on your bed the night of your first shower and do not sleep with pets.  13. Shower again with the CHG soap on the day  of surgery prior to arriving at the hospital.  14. Do not apply any deodorants/lotions/powders.  15. Please wear clean clothes to the hospital.

## 2022-03-22 ENCOUNTER — Observation Stay: Payer: 59

## 2022-03-22 ENCOUNTER — Observation Stay
Admission: RE | Admit: 2022-03-22 | Discharge: 2022-03-23 | Disposition: A | Discharge status: Home / Self Care | Payer: 59 | Attending: Orthopedic Surgery | Admitting: Orthopedic Surgery

## 2022-03-22 ENCOUNTER — Encounter: Payer: Self-pay | Admitting: Orthopedic Surgery

## 2022-03-22 ENCOUNTER — Ambulatory Visit: Payer: 59 | Admitting: General Practice

## 2022-03-22 ENCOUNTER — Other Ambulatory Visit: Payer: Self-pay

## 2022-03-22 ENCOUNTER — Encounter
Admission: RE | Disposition: A | Discharge status: Home / Self Care | Payer: Self-pay | Source: Home / Self Care | Attending: Orthopedic Surgery

## 2022-03-22 DIAGNOSIS — M24151 Other articular cartilage disorders, right hip: Secondary | ICD-10-CM | POA: Diagnosis not present

## 2022-03-22 DIAGNOSIS — M76891 Other specified enthesopathies of right lower limb, excluding foot: Secondary | ICD-10-CM | POA: Diagnosis not present

## 2022-03-22 DIAGNOSIS — M25851 Other specified joint disorders, right hip: Secondary | ICD-10-CM | POA: Diagnosis present

## 2022-03-22 DIAGNOSIS — M25852 Other specified joint disorders, left hip: Secondary | ICD-10-CM | POA: Diagnosis present

## 2022-03-22 DIAGNOSIS — Z01812 Encounter for preprocedural laboratory examination: Secondary | ICD-10-CM

## 2022-03-22 HISTORY — PX: HIP ARTHROSCOPY: SHX668

## 2022-03-22 LAB — POCT PREGNANCY, URINE: Preg Test, Ur: NEGATIVE

## 2022-03-22 SURGERY — ARTHROSCOPY HIP
Anesthesia: General | Site: Hip | Laterality: Left

## 2022-03-22 MED ORDER — ACETAMINOPHEN 500 MG PO TABS
1000.0000 mg | ORAL_TABLET | Freq: Three times a day (TID) | ORAL | Status: DC
  Administered 2022-03-22 – 2022-03-23 (×3): 1000 mg via ORAL
  Filled 2022-03-22 (×3): qty 2

## 2022-03-22 MED ORDER — PHENYLEPHRINE HCL (PRESSORS) 10 MG/ML IV SOLN
INTRAVENOUS | Status: DC | PRN
  Administered 2022-03-22: 40 ug via INTRAVENOUS
  Administered 2022-03-22 (×2): 80 ug via INTRAVENOUS
  Administered 2022-03-22 (×2): 40 ug via INTRAVENOUS
  Administered 2022-03-22: 80 ug via INTRAVENOUS
  Administered 2022-03-22: 40 ug via INTRAVENOUS

## 2022-03-22 MED ORDER — DOCUSATE SODIUM 100 MG PO CAPS
100.0000 mg | ORAL_CAPSULE | Freq: Two times a day (BID) | ORAL | Status: DC
Start: 2022-03-22 — End: 2022-03-23
  Administered 2022-03-22 – 2022-03-23 (×2): 100 mg via ORAL
  Filled 2022-03-22 (×2): qty 1

## 2022-03-22 MED ORDER — FENTANYL CITRATE (PF) 100 MCG/2ML IJ SOLN
INTRAMUSCULAR | Status: AC
  Filled 2022-03-22: qty 2

## 2022-03-22 MED ORDER — CEFAZOLIN SODIUM-DEXTROSE 2-4 GM/100ML-% IV SOLN
2.0000 g | Freq: Four times a day (QID) | INTRAVENOUS | Status: AC
  Administered 2022-03-22 – 2022-03-23 (×3): 2 g via INTRAVENOUS
  Filled 2022-03-22 (×3): qty 100

## 2022-03-22 MED ORDER — CEFAZOLIN SODIUM-DEXTROSE 2-4 GM/100ML-% IV SOLN
INTRAVENOUS | Status: AC
  Filled 2022-03-22: qty 100

## 2022-03-22 MED ORDER — LACTATED RINGERS IV SOLN
INTRAVENOUS | Status: DC | PRN
  Administered 2022-03-22: 8000 mL

## 2022-03-22 MED ORDER — SUCCINYLCHOLINE CHLORIDE 200 MG/10ML IV SOSY: PREFILLED_SYRINGE | INTRAVENOUS | Status: DC | PRN

## 2022-03-22 MED ORDER — OXYCODONE HCL 5 MG PO TABS: 10.0000 mg | ORAL_TABLET | ORAL | Status: DC | PRN

## 2022-03-22 MED ORDER — METHOCARBAMOL 500 MG PO TABS
500.0000 mg | ORAL_TABLET | Freq: Four times a day (QID) | ORAL | Status: DC | PRN
  Administered 2022-03-23: 500 mg via ORAL
  Filled 2022-03-22: qty 1

## 2022-03-22 MED ORDER — ONDANSETRON HCL 4 MG/2ML IJ SOLN
INTRAMUSCULAR | Status: DC | PRN
  Administered 2022-03-22: 4 mg via INTRAVENOUS

## 2022-03-22 MED ORDER — CHLORHEXIDINE GLUCONATE 0.12 % MT SOLN
OROMUCOSAL | Status: AC
  Administered 2022-03-22: 15 mL via OROMUCOSAL
  Filled 2022-03-22: qty 15

## 2022-03-22 MED ORDER — ONDANSETRON HCL 4 MG/2ML IJ SOLN
4.0000 mg | Freq: Four times a day (QID) | INTRAMUSCULAR | Status: DC | PRN
  Administered 2022-03-23: 4 mg via INTRAVENOUS
  Filled 2022-03-22: qty 2

## 2022-03-22 MED ORDER — PROPOFOL 10 MG/ML IV BOLUS
INTRAVENOUS | Status: AC
  Filled 2022-03-22: qty 40

## 2022-03-22 MED ORDER — PROMETHAZINE HCL 25 MG/ML IJ SOLN
INTRAMUSCULAR | Status: AC
  Filled 2022-03-22: qty 1

## 2022-03-22 MED ORDER — PROPOFOL 10 MG/ML IV BOLUS
INTRAVENOUS | Status: DC | PRN
  Administered 2022-03-22: 150 mg via INTRAVENOUS

## 2022-03-22 MED ORDER — SODIUM CHLORIDE 0.9 % IV SOLN: INTRAVENOUS | Status: DC

## 2022-03-22 MED ORDER — KETOROLAC TROMETHAMINE 15 MG/ML IJ SOLN
15.0000 mg | Freq: Four times a day (QID) | INTRAMUSCULAR | Status: AC
  Administered 2022-03-22 – 2022-03-23 (×3): 15 mg via INTRAVENOUS
  Filled 2022-03-22 (×2): qty 1

## 2022-03-22 MED ORDER — ROCURONIUM BROMIDE 100 MG/10ML IV SOLN
INTRAVENOUS | Status: DC | PRN
  Administered 2022-03-22: 30 mg via INTRAVENOUS
  Administered 2022-03-22: 20 mg via INTRAVENOUS
  Administered 2022-03-22: 30 mg via INTRAVENOUS
  Administered 2022-03-22: 20 mg via INTRAVENOUS
  Administered 2022-03-22: 30 mg via INTRAVENOUS
  Administered 2022-03-22: 20 mg via INTRAVENOUS
  Administered 2022-03-22: 50 mg via INTRAVENOUS
  Administered 2022-03-22: 20 mg via INTRAVENOUS
  Administered 2022-03-22: 30 mg via INTRAVENOUS

## 2022-03-22 MED ORDER — BUPIVACAINE LIPOSOME 1.3 % IJ SUSP
INTRAMUSCULAR | Status: AC
  Filled 2022-03-22: qty 20

## 2022-03-22 MED ORDER — MINERAL OIL LIGHT OIL
TOPICAL_OIL | Status: AC
  Filled 2022-03-22: qty 10

## 2022-03-22 MED ORDER — BUPIVACAINE HCL (PF) 0.5 % IJ SOLN
INTRAMUSCULAR | Status: AC
  Filled 2022-03-22: qty 30

## 2022-03-22 MED ORDER — FAMOTIDINE 20 MG PO TABS
ORAL_TABLET | ORAL | Status: AC
  Administered 2022-03-22: 20 mg via ORAL
  Filled 2022-03-22: qty 1

## 2022-03-22 MED ORDER — ONDANSETRON 4 MG PO TBDP: 4.0000 mg | ORAL_TABLET | Freq: Four times a day (QID) | ORAL | Status: DC | PRN

## 2022-03-22 MED ORDER — HYDROMORPHONE HCL 1 MG/ML IJ SOLN: 0.2000 mg | INTRAMUSCULAR | Status: DC | PRN

## 2022-03-22 MED ORDER — METHIMAZOLE 5 MG PO TABS
5.0000 mg | ORAL_TABLET | Freq: Every day | ORAL | Status: DC
  Administered 2022-03-22: 5 mg via ORAL
  Filled 2022-03-22: qty 1

## 2022-03-22 MED ORDER — BUPIVACAINE LIPOSOME 1.3 % IJ SUSP: INTRAMUSCULAR | Status: DC | PRN

## 2022-03-22 MED ORDER — HYDROCHLOROTHIAZIDE 25 MG PO TABS
25.0000 mg | ORAL_TABLET | Freq: Every day | ORAL | Status: DC
  Administered 2022-03-22 – 2022-03-23 (×2): 25 mg via ORAL
  Filled 2022-03-22 (×2): qty 1

## 2022-03-22 MED ORDER — ROCURONIUM BROMIDE 10 MG/ML (PF) SYRINGE
PREFILLED_SYRINGE | INTRAVENOUS | Status: AC
  Filled 2022-03-22: qty 10

## 2022-03-22 MED ORDER — ROCURONIUM BROMIDE 10 MG/ML (PF) SYRINGE
PREFILLED_SYRINGE | INTRAVENOUS | Status: AC
Start: 2022-03-22 — End: ?
  Filled 2022-03-22: qty 10

## 2022-03-22 MED ORDER — SODIUM CHLORIDE 0.9 % IR SOLN: Status: DC | PRN

## 2022-03-22 MED ORDER — MIDAZOLAM HCL 2 MG/2ML IJ SOLN
INTRAMUSCULAR | Status: DC | PRN
  Administered 2022-03-22: 2 mg via INTRAVENOUS

## 2022-03-22 MED ORDER — EPINEPHRINE PF 1 MG/ML IJ SOLN
INTRAMUSCULAR | Status: AC
  Filled 2022-03-22: qty 4

## 2022-03-22 MED ORDER — METHOCARBAMOL 1000 MG/10ML IJ SOLN
500.0000 mg | Freq: Four times a day (QID) | INTRAVENOUS | Status: DC | PRN
  Administered 2022-03-22: 500 mg via INTRAVENOUS
  Filled 2022-03-22: qty 5

## 2022-03-22 MED ORDER — FREMANEZUMAB-VFRM 225 MG/1.5ML ~~LOC~~ SOSY
1.0000 | PREFILLED_SYRINGE | SUBCUTANEOUS | Status: DC
Start: 2022-03-22 — End: 2022-03-22

## 2022-03-22 MED ORDER — BUPIVACAINE LIPOSOME 1.3 % IJ SUSP
INTRAMUSCULAR | Status: DC | PRN
  Administered 2022-03-22: 30 mL via SURGICAL_CAVITY

## 2022-03-22 MED ORDER — DEXAMETHASONE SODIUM PHOSPHATE 10 MG/ML IJ SOLN
INTRAMUSCULAR | Status: DC | PRN
  Administered 2022-03-22: 10 mg via INTRAVENOUS

## 2022-03-22 MED ORDER — ONDANSETRON HCL 4 MG PO TABS
4.0000 mg | ORAL_TABLET | Freq: Four times a day (QID) | ORAL | Status: DC | PRN
  Administered 2022-03-22: 4 mg via ORAL
  Filled 2022-03-22: qty 1

## 2022-03-22 MED ORDER — FENTANYL CITRATE (PF) 100 MCG/2ML IJ SOLN
INTRAMUSCULAR | Status: DC | PRN
  Administered 2022-03-22 (×8): 25 ug via INTRAVENOUS

## 2022-03-22 MED ORDER — BUPROPION HCL ER (XL) 150 MG PO TB24
300.0000 mg | ORAL_TABLET | Freq: Every day | ORAL | Status: DC
  Administered 2022-03-22: 300 mg via ORAL
  Filled 2022-03-22: qty 2

## 2022-03-22 MED ORDER — LACTATED RINGERS IV SOLN: INTRAVENOUS | Status: DC

## 2022-03-22 MED ORDER — SUGAMMADEX SODIUM 200 MG/2ML IV SOLN
INTRAVENOUS | Status: DC | PRN
  Administered 2022-03-22: 150 mg via INTRAVENOUS

## 2022-03-22 MED ORDER — SODIUM CHLORIDE 0.9 % IV SOLN
12.5000 mg | Freq: Four times a day (QID) | INTRAVENOUS | Status: DC | PRN
  Administered 2022-03-22 – 2022-03-23 (×3): 12.5 mg via INTRAVENOUS
  Filled 2022-03-22: qty 12.5
  Filled 2022-03-22: qty 0.5
  Filled 2022-03-22: qty 12.5

## 2022-03-22 MED ORDER — POTASSIUM CHLORIDE CRYS ER 20 MEQ PO TBCR
20.0000 meq | EXTENDED_RELEASE_TABLET | Freq: Every day | ORAL | Status: DC
  Administered 2022-03-22 – 2022-03-23 (×2): 20 meq via ORAL
  Filled 2022-03-22 (×2): qty 1

## 2022-03-22 MED ORDER — OXYCODONE HCL 5 MG/5ML PO SOLN: 5.0000 mg | Freq: Once | ORAL | Status: DC | PRN

## 2022-03-22 MED ORDER — PROMETHAZINE HCL 25 MG/ML IJ SOLN
6.2500 mg | Freq: Once | INTRAMUSCULAR | Status: AC
  Administered 2022-03-22: 6.25 mg via INTRAVENOUS

## 2022-03-22 MED ORDER — SENNOSIDES-DOCUSATE SODIUM 8.6-50 MG PO TABS: 1.0000 | ORAL_TABLET | Freq: Every evening | ORAL | Status: DC | PRN

## 2022-03-22 MED ORDER — OXYCODONE HCL 5 MG PO TABS
5.0000 mg | ORAL_TABLET | ORAL | Status: DC | PRN
  Administered 2022-03-22 – 2022-03-23 (×4): 10 mg via ORAL
  Filled 2022-03-22 (×4): qty 2

## 2022-03-22 MED ORDER — NAPROXEN 500 MG PO TABS
500.0000 mg | ORAL_TABLET | Freq: Two times a day (BID) | ORAL | Status: DC
  Filled 2022-03-22 (×2): qty 1

## 2022-03-22 MED ORDER — CEFAZOLIN SODIUM-DEXTROSE 2-4 GM/100ML-% IV SOLN
2.0000 g | INTRAVENOUS | Status: AC
  Administered 2022-03-22 (×2): 2 g via INTRAVENOUS

## 2022-03-22 MED ORDER — FENTANYL CITRATE (PF) 100 MCG/2ML IJ SOLN
INTRAMUSCULAR | Status: AC
  Administered 2022-03-22: 25 ug via INTRAVENOUS
  Filled 2022-03-22: qty 2

## 2022-03-22 MED ORDER — DIPHENHYDRAMINE HCL 12.5 MG/5ML PO ELIX
12.5000 mg | ORAL_SOLUTION | ORAL | Status: DC | PRN
  Administered 2022-03-22: 25 mg via ORAL
  Filled 2022-03-22: qty 10

## 2022-03-22 MED ORDER — FAMOTIDINE 20 MG PO TABS: 20.0000 mg | ORAL_TABLET | Freq: Once | ORAL | Status: AC

## 2022-03-22 MED ORDER — LIDOCAINE HCL (CARDIAC) PF 100 MG/5ML IV SOSY
PREFILLED_SYRINGE | INTRAVENOUS | Status: DC | PRN
  Administered 2022-03-22: 60 mg via INTRAVENOUS

## 2022-03-22 MED ORDER — LACTATED RINGERS IV SOLN: INTRAVENOUS | Status: DC | PRN

## 2022-03-22 MED ORDER — MECLIZINE HCL 25 MG PO TABS: 25.0000 mg | ORAL_TABLET | ORAL | Status: DC | PRN

## 2022-03-22 MED ORDER — LACTATED RINGERS IR SOLN
Status: DC | PRN
  Administered 2022-03-22: 6000 mL
  Administered 2022-03-22 (×4): 12000 mL
  Administered 2022-03-22 (×2): 6000 mL
  Administered 2022-03-22: 12000 mL
  Administered 2022-03-22 (×2): 6000 mL

## 2022-03-22 MED ORDER — CHLORHEXIDINE GLUCONATE 0.12 % MT SOLN: 15.0000 mL | Freq: Once | OROMUCOSAL | Status: AC

## 2022-03-22 MED ORDER — DEXMEDETOMIDINE (PRECEDEX) IN NS 20 MCG/5ML (4 MCG/ML) IV SYRINGE
PREFILLED_SYRINGE | INTRAVENOUS | Status: DC | PRN
  Administered 2022-03-22 (×2): 4 ug via INTRAVENOUS

## 2022-03-22 MED ORDER — MIDAZOLAM HCL 2 MG/2ML IJ SOLN
INTRAMUSCULAR | Status: AC
Start: 2022-03-22 — End: ?
  Filled 2022-03-22: qty 2

## 2022-03-22 MED ORDER — LEVONORGESTREL 20 MCG/DAY IU IUD: 1.0000 | INTRAUTERINE_SYSTEM | Freq: Once | INTRAUTERINE | Status: DC

## 2022-03-22 MED ORDER — ADULT MULTIVITAMIN W/MINERALS CH
1.0000 | ORAL_TABLET | Freq: Every day | ORAL | Status: DC
  Administered 2022-03-22 – 2022-03-23 (×2): 1 via ORAL
  Filled 2022-03-22 (×2): qty 1

## 2022-03-22 MED ORDER — KETOROLAC TROMETHAMINE 15 MG/ML IJ SOLN
INTRAMUSCULAR | Status: AC
  Filled 2022-03-22: qty 1

## 2022-03-22 MED ORDER — FENTANYL CITRATE (PF) 100 MCG/2ML IJ SOLN
25.0000 ug | INTRAMUSCULAR | Status: DC | PRN
  Administered 2022-03-22 (×2): 25 ug via INTRAVENOUS

## 2022-03-22 MED ORDER — CYANOCOBALAMIN-METHYLCOBALAMIN 600-600 MCG SL SUBL
1.0000 | SUBLINGUAL_TABLET | Freq: Every day | SUBLINGUAL | Status: DC
Start: 2022-03-22 — End: 2022-03-22

## 2022-03-22 MED ORDER — SEVOFLURANE IN SOLN
RESPIRATORY_TRACT | Status: AC
  Filled 2022-03-22: qty 250

## 2022-03-22 MED ORDER — OXYCODONE HCL 5 MG PO TABS: 5.0000 mg | ORAL_TABLET | Freq: Once | ORAL | Status: DC | PRN

## 2022-03-22 MED ORDER — ORAL CARE MOUTH RINSE: 15.0000 mL | Freq: Once | OROMUCOSAL | Status: AC

## 2022-03-22 SURGICAL SUPPLY — 65 items
ADAPTER IRRIG TUBE 2 SPIKE SOL (ADAPTER) ×2 IMPLANT
ANCHOR SUT 1.4 FLEX (Anchor) IMPLANT
ANCHOR SUT 2.4 SS KNTLS #1 (Anchor) IMPLANT
BIT DRILL FLEX NANOTACK (BIT) IMPLANT
BIT DRILL SS CINCHLOCK (BIT) ×1 IMPLANT
BLADE SAMURAI STR FULL RADIUS (BLADE) ×1 IMPLANT
BLADE SURG SZ11 CARB STEEL (BLADE) ×1 IMPLANT
BNDG ADH 1X3 SHEER STRL LF (GAUZE/BANDAGES/DRESSINGS) ×5 IMPLANT
BNDG ADH 2 X3.75 FABRIC TAN LF (GAUZE/BANDAGES/DRESSINGS) IMPLANT
BUR 4.0 ROUND XL DIAMOND (BUR) ×1 IMPLANT
BUR 5.5 ROUND LONG FS 8 FLUTE (BUR) ×1 IMPLANT
CANNULA 8 456 TRANSPORT (CANNULA) IMPLANT
CANNULA 8 789 TRANSPORT (CANNULA) ×1 IMPLANT
CANNULA OBTURATOR FLOWPORT (CANNULA) ×1 IMPLANT
CHLORAPREP W/TINT 26 (MISCELLANEOUS) ×1 IMPLANT
COOLER POLAR GLACIER W/PUMP (MISCELLANEOUS) IMPLANT
CUTTER AGGRESSIVE PLUS 4D 180L (CUTTER) ×1 IMPLANT
DEVICE SUCT BLK HOLE OR FLOOR (MISCELLANEOUS) ×1 IMPLANT
DRAPE 3/4 80X56 (DRAPES) IMPLANT
DRAPE ARTHRO LIMB 89X125 STRL (DRAPES) ×1 IMPLANT
DRAPE C-ARM 42X72 X-RAY (DRAPES) ×1 IMPLANT
DRAPE SURG 17X11 SM STRL (DRAPES) ×1 IMPLANT
DRAPE U-SHAPE 47X51 STRL (DRAPES) IMPLANT
GAUZE SPONGE 4X4 12PLY STRL (GAUZE/BANDAGES/DRESSINGS) ×1 IMPLANT
GLOVE BIO SURGEON STRL SZ7.5 (GLOVE) ×2 IMPLANT
GLOVE BIOGEL PI IND STRL 8 (GLOVE) ×1 IMPLANT
GLOVE SURG ORTHO 8.0 STRL STRW (GLOVE) ×2 IMPLANT
GLOVE SURG UNDER LTX SZ8 (GLOVE) ×1 IMPLANT
GOWN STRL REUS W/ TWL LRG LVL3 (GOWN DISPOSABLE) ×1 IMPLANT
GOWN STRL REUS W/ TWL XL LVL3 (GOWN DISPOSABLE) ×1 IMPLANT
GOWN STRL REUS W/TWL LRG LVL3 (GOWN DISPOSABLE) ×1
GOWN STRL REUS W/TWL XL LVL3 (GOWN DISPOSABLE) ×1
IV LACTATED RINGER IRRG 3000ML (IV SOLUTION) ×34
IV LR IRRIG 3000ML ARTHROMATIC (IV SOLUTION) ×6 IMPLANT
KIT PATIENT POSITION MEDIUM (KITS) ×1 IMPLANT
KIT PORTAL ENTRY HIP ACCESS (KITS) ×1 IMPLANT
MANIFOLD NEPTUNE II (INSTRUMENTS) ×2 IMPLANT
MAT ABSORB  FLUID 56X50 GRAY (MISCELLANEOUS) ×1
MAT ABSORB FLUID 56X50 GRAY (MISCELLANEOUS) ×1 IMPLANT
NDL INJECTOR II CARTRIDGE (MISCELLANEOUS) ×1 IMPLANT
NDL SAFETY ECLIP 18X1.5 (MISCELLANEOUS) ×1 IMPLANT
NEEDLE INJECTOR II CARTRIDGE (MISCELLANEOUS) ×2 IMPLANT
PACK ARTHROSCOPY KNEE (MISCELLANEOUS) ×2 IMPLANT
PAD ABD DERMACEA PRESS 5X9 (GAUZE/BANDAGES/DRESSINGS) ×1 IMPLANT
PAD ARMBOARD 7.5X6 YLW CONV (MISCELLANEOUS) ×1 IMPLANT
PAD WRAPON POLOR MULTI XL (MISCELLANEOUS) IMPLANT
PASSER SUT 1.5D CRESCENT (INSTRUMENTS) ×1 IMPLANT
PASSER SUT 70D UP ANGLED (INSTRUMENTS) ×1 IMPLANT
SERFAS 50-S SWEEP XL (INSTRUMENTS) ×1
SPONGE T-LAP 18X18 ~~LOC~~+RFID (SPONGE) ×1 IMPLANT
SUT ETHILON 3-0 FS-10 30 BLK (SUTURE) ×2
SUT VIC AB 2-0 CT2 27 (SUTURE) ×1 IMPLANT
SUT XBRAID 1.4 BLUE (SUTURE) IMPLANT
SUT ZIPLINE SZ2 BLK (SUTURE) ×3 IMPLANT
SUT ZIPLINE SZ2 GREEN (SUTURE) ×6 IMPLANT
SUTURE EHLN 3-0 FS-10 30 BLK (SUTURE) ×1 IMPLANT
TRAP FLUID SMOKE EVACUATOR (MISCELLANEOUS) ×1 IMPLANT
TRAY FOLEY SLVR 16FR LF STAT (SET/KITS/TRAYS/PACK) ×1 IMPLANT
TUBING CONNECTING 10 (TUBING) IMPLANT
TUBING INFLOW SET DBFLO PUMP (TUBING) ×1 IMPLANT
TUBING OUTFLOW SET DBLFO PUMP (TUBING) ×1 IMPLANT
WAND SERFAS 50-S SWEEP XL (INSTRUMENTS) ×1 IMPLANT
WATER STERILE IRR 500ML POUR (IV SOLUTION) ×1 IMPLANT
WRAP-ON POLOR PAD MULTI XL (MISCELLANEOUS) ×1
WRAPON POLOR PAD MULTI XL (MISCELLANEOUS) ×1

## 2022-03-22 NOTE — Anesthesia Preprocedure Evaluation (Signed)
Anesthesia Evaluation  Patient identified by MRN, date of birth, ID band Patient awake    Reviewed: Allergy & Precautions, NPO status , Patient's Chart, lab work & pertinent test results  History of Anesthesia Complications Negative for: history of anesthetic complications  Airway Mallampati: II  TM Distance: >3 FB Neck ROM: full    Dental  (+) Dental Advidsory Given, Teeth Intact   Pulmonary neg pulmonary ROS, neg shortness of breath, neg recent URI,    Pulmonary exam normal        Cardiovascular (-) angina(-) Past MI and (-) CABG negative cardio ROS Normal cardiovascular exam     Neuro/Psych PSYCHIATRIC DISORDERS negative neurological ROS     GI/Hepatic negative GI ROS, Neg liver ROS,   Endo/Other  negative endocrine ROS  Renal/GU      Musculoskeletal   Abdominal   Peds  Hematology negative hematology ROS (+)   Anesthesia Other Findings Past Medical History: No date: Anemia No date: Anxiety No date: Endometriosis No date: Factor 5 Leiden mutation, heterozygous (HCC) No date: Meniere disease No date: Migraine No date: Osteoarthritis of left hip No date: PCO (polycystic ovaries) No date: PONV (postoperative nausea and vomiting) 09/2018: Shingles outbreak No date: Spondylosis 2014: SVT (supraventricular tachycardia) (HCC) No date: Thyroiditis  Past Surgical History: 2008: ABDOMINOPLASTY 2014: CARDIAC ELECTROPHYSIOLOGY STUDY AND ABLATION 2006: CESAREAN SECTION 2008: HERNIA REPAIR     Comment:  umbilical 2021: LIPOSUCTION TRUNK 2022: MASTOPEXY; Bilateral 2003: NASAL SINUS SURGERY 2018: PELVIC LAPAROSCOPY     Comment:  endometriosis adhesions 06/11/2021: POSTERIOR LUMBAR FUSION     Comment:  L5-S1  BMI    Body Mass Index: 20.91 kg/m      Reproductive/Obstetrics negative OB ROS                             Anesthesia Physical Anesthesia Plan  ASA: 2  Anesthesia  Plan: General   Post-op Pain Management:    Induction: Intravenous  PONV Risk Score and Plan: 3 and Ondansetron, Dexamethasone, Midazolam and Treatment may vary due to age or medical condition  Airway Management Planned: Oral ETT  Additional Equipment:   Intra-op Plan:   Post-operative Plan: Extubation in OR  Informed Consent: I have reviewed the patients History and Physical, chart, labs and discussed the procedure including the risks, benefits and alternatives for the proposed anesthesia with the patient or authorized representative who has indicated his/her understanding and acceptance.     Dental Advisory Given  Plan Discussed with: Anesthesiologist, CRNA and Surgeon  Anesthesia Plan Comments: (Patient consented for risks of anesthesia including but not limited to:  - adverse reactions to medications - damage to eyes, teeth, lips or other oral mucosa - nerve damage due to positioning  - sore throat or hoarseness - Damage to heart, brain, nerves, lungs, other parts of body or loss of life  Patient voiced understanding.)        Anesthesia Quick Evaluation

## 2022-03-22 NOTE — Anesthesia Postprocedure Evaluation (Signed)
Anesthesia Post Note  Patient: ORELIA BRANDSTETTER  Procedure(s) Performed: Left hip arthroscopy, acetabuloplasty, labral repair, femoral osteochondroplasty, capsular closure (Left: Hip)  Patient location during evaluation: PACU Anesthesia Type: General Level of consciousness: awake and alert Pain management: pain level controlled Vital Signs Assessment: post-procedure vital signs reviewed and stable Respiratory status: spontaneous breathing, nonlabored ventilation, respiratory function stable and patient connected to nasal cannula oxygen Cardiovascular status: blood pressure returned to baseline and stable Postop Assessment: no apparent nausea or vomiting Anesthetic complications: no   No notable events documented.   Last Vitals:  Vitals:   03/22/22 1500 03/22/22 1511  BP: (!) 112/55   Pulse: 98 95  Resp: 13 11  Temp:    SpO2: 100% 100%    Last Pain:  Vitals:   03/22/22 1511  TempSrc:   PainSc: 6                  Louie Boston

## 2022-03-22 NOTE — Op Note (Signed)
Operative Note    SURGERY DATE: 03/22/2022   PRE-OP DIAGNOSIS:  1. Left femoroacetabular impingement 2. Left hip labral tear   POST-OP DIAGNOSIS:  1. Left femoroacetabular impingement 2. Left hip labral tear   PROCEDURES:  1. Left hip arthroscopy with acetabuloplasty, labral repair, femoral osteochondroplasty, and capsular closure   SURGEON: Cato Mulligan, MD  ASSISTANT: Reche Dixon, PA; Evette Georges, PA-S    ANESTHESIA: Gen   ESTIMATED BLOOD LOSS: minimal   TOTAL IV FLUIDS: per anesthesia  INDICATION(S): The patient is a 41 y.o. year old female who presents with persistent hip pain.  Radiographs demonstrated FAI morphology and the MRI revealed a labral tear.  She has failed greater than 3 months of non-operative treatment to date including activity modifications, physical therapy, and corticosteroid injection.  Please see the preoperative notes for further detail.   She elected to undergo the above mentioned procedure after detailed explanation of the expected outcomes and recovery path.   Informed consent was obtained outlining the expected benefits and possible risks of the surgery including a less than 5% chance of numbness in the sciatic or pudendal nerve regions, 20% chance of injury to the lateral femoral cutaneous nerve (1% permanent injury). 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  OPERATIVE FINDINGS: Cartilage Mild significant degenerative changes of the acetabular cartilage along the labral tear extending 104mm into the joint at the apex High grade cartilage lesion: no Length along the periphery:  11 o'clock to 2 o'clock position Delamination: no Bone exposed: no Bruising: no Localization of femoral head high grade lesion: none  Cotyloid fossa osteophytes:  none The remainder of the femoral and acetabular cartilage was normal.  Labrum Labral degeneration,  yellow over 50% of labrum: no Complexity of tearing:  Tear at chondrolabral junction Hypoplastic labrum: no Hyperplastic labrum:  mildly hyperplastic Lipstick sign at the psoas prominence: none Psoas Prominence: no  Boundaries of labral tear Convention (3 o'clock anterior, 9 o'clock posterior) Anterior boundary: 11 o'clock Posterior boundary: 2 o'clock  Ligamentum teres Hypertrophy:  no Tear: no   OPERATIVE REPORT:  The patient was brought to the operating room, placed supine on the operating table, and bony prominences were padded.  The traction boots were applied with padding to ensure that safe traction could be applied through the feet.  The contralateral limb was abducted slightly and light traction was applied.  The operative leg was brought into neutral position.  Appropriate preoperative IV antibiotics were administered. The patient was prepped and draped in a sterile fashion.  Time-out was performed and landmarks were identified. The suction seal was broken with approximately 80 mmHg.  An air arthrogram was performed, and traction was let down, reducing the hip.  There is no difference between pretraction images and post traction images suggesting that the patient did not have a significant component of instability.  Traction was applied to the hip with approximate 70 mmHg.  This allowed for safe access to the joint of 8-59mm.  This was checked with fluoroscopy.   Next we placed an anterolateral portal under the assistance of fluoroscopy.  First, fluoroscopy was used to estimate the trajectory and starting point.  A 94mm incision with a #11 blade was made and a straight hemostat was used to dilate the portal through the appropriate tract.  We then placed a 14-gauge hypodermic needle with careful technique to be as close to the femoral head as  possible and parallel to the sourcil to ensure no iatrogenic damage to the labrum.  This released the negative pressure environment and the amount of  traction was adjusted to maintain the 8-93mm of distraction.  A nitinol wire was placed through the needle and flouroscopy was used to ensure it extended to the medial wall of the acetabulum.  The Flowport from [employer] was placed over the wire and the nitinol wire was retracted to just inside the capsule during insertion of the dilator and cannula to minimize the risk of breakage. The arthroscope was placed next and we visualized the anterior triangle.  We then placed the anterior portal under direct visualization using the technique described above.  This was safely placed as well without damage to the labrum or femoral head.  We then switched our arthroscope to the anterior portal to ensure we were not through the labrum - we were safely through the capsule only.  We then proceeded with a transverse capsulotomy connecting the 2 portals in the same plane utilizing the Samurai blade from [employer].  The Injector device from [employer] was used to place traction stitches each in the medial and lateral arms of the proximal capsule.  A Kelly clamp was used to hold the suture against the skin to apply traction. This allowed access to the acetabular rim and labrum as well as protection of the native edges of the capsule.  We identified the anterior inferior iliac spine proximally, the psoas tendon medially and the rectus tendon laterally as landmarks.  We then proceeded with a diagnostic arthroscopy - the results can be found in the findings section above.   We then used the 50 degree hip specific radiofrequency device and a 31mm shaver to clear the superior acetabulum and expose the subspinous region.  Next we exposed the acetabular rim leaving the chondrolabral junction intact.  Working from both portals, the acetabular rim/subspinous region was reshaped with a 4.6mm diamond burr consistent with the preoperative three-dimensional imaging. Adequate reshaping and acetabuloplasty was confirmed with  fluoroscopy  Next, we then proceeded with the labral repair.  A distal anterolateral portal was placed under direct visualization and the Transport cannula was inserted.  Care was taken to ensure the cannula was in the intermuscular plane between the gluteus minimus and iliocapsularis.  This portal was approximately 4cm distal and 1cm anterior to the anterolateral portal.      We placed 4 anchors at the 11 o'clock, 12 o'clock, 1 o'clock, and 2 o'clock positions.  A circumferential simple stitch was placed at all positions except for the 1:00 stitch which was a vertical mattress.  The sutures were passed using the crescent Nanopass from [employer].  This resulted in anatomic labral repair.  3 CinchLock anchors were placed from the 12-2:00 positions.  A Nanotak anchor was placed for the 11:00 anchor and arthroscopic knots were placed to reduce the labral tear in this region.  We debrided the loose cartilage at the rim and residual degenerative labral tissue.  Traction was let down with total traction time of 156 minutes.    We then turned our attention to the peripheral compartment.  We flexed the hip to 45 degrees. We viewed from the anterior portal and worked from the distal anterolateral portal.  First we passed two traction stitches in the medial and lateral sides of the capsulotomy.  In between these sutures, we performed a T capsulotomy with the samurai blade from [employer] down to the intertrochanteric  line in the plane between the iliocapsularis and gluteus minimus. The position of the T-capsulotomy was checked with fluoroscopy to ensure a safe position along the anterolateral neck.  Next we placed one additional traction stitch in the lateral limb of the T-capsulotomy.  Clamps were used to hold these stitches against the skin for retraction.  Adequate mobilization and retraction was achieved such that we could view the entire CAM lesion. The capsule was retracted with a switching stick through  the AL portal when necessary.  We were able to view the medial and lateral synovial folds, identifying the medial and lateral circumflex arteries.  These were protected during the osteoplasty.  A 5.21mm burr was used to reshape the femoral neck.  The initial line of resection was defined with the use of dynamic exam and fluoroscopy.  The line was parallel to the acetabular rim with the leg in neutral rotation.  We viewed the base of the femoral neck to provide a foundation for the shape and size of the osteochondroplasty. We were able to adequately remove the cam lesion and reshape the femoral neck.  This was confirmed with fluoroscopy.  Dynamic exam showed no residual impingement flexed to 90 degrees with maximal internal rotation and external rotation.  Finally, we performed a complete capsular closure with Zipline suture from [employer], utilizing the Slingshot from [employer] to pass the suture.  A figure of 8 stitche was placed in the vertical limb of the T capsulotomy and tied. Four additional simple stitches were placed in interportal capsulotomy. These were then tied sequentially with alternating half hitches through an 8.18mm Transport cannula. Reapproximation of the capsule was confirmed.   We then removed the arthroscope and closed the incisions with 2-0 Vicryl subdermally and 3-0 nylon stitches.  Local anesthetic was injected about the portals and tracts down to the joint. A sterile dressing was applied and hip brace was applied.  The patient was awakened from anesthesia and transferred to PACU in stable condition.  Of note, all extremities and available joints were mobilized approximately every 1-2 hours during this surgery to avoid complications from prolonged immobilization.   Of note, assistance from a PA was essential to performing the surgery.  PA was present for the entire surgery.  PA assisted with patient positioning, retraction, instrumentation, and wound closure. The surgery would  have been more difficult and had longer operative time without PA assistance.    POSTOPERATIVE PLAN: The patient will be admitted overnight for observation.  Flatfoot weightbearing with hip abduction brace x3 weeks.  PT/OT in a.m.  Perioperative antibiotics x24 hours.  Patient will follow-up as an outpatient in 2 weeks.  Outpatient physical therapy to start in 3-4 days.

## 2022-03-22 NOTE — Transfer of Care (Signed)
Immediate Anesthesia Transfer of Care Note  Patient: DERHONDA EASTLICK  Procedure(s) Performed: Left hip arthroscopy, acetabuloplasty, labral repair, femoral osteochondroplasty, capsular closure (Left: Hip)  Patient Location: PACU  Anesthesia Type:General  Level of Consciousness: drowsy  Airway & Oxygen Therapy: Patient Spontanous Breathing and Patient connected to nasal cannula oxygen  Post-op Assessment: Report given to RN and Post -op Vital signs reviewed and stable  Post vital signs: Reviewed and stable  Last Vitals:  Vitals Value Taken Time  BP 104/59 03/22/22 1417  Temp 36.2 C 03/22/22 1417  Pulse 103 03/22/22 1422  Resp 13 03/22/22 1422  SpO2 100 % 03/22/22 1422  Vitals shown include unvalidated device data.  Last Pain:  Vitals:   03/22/22 1417  TempSrc:   PainSc: Asleep         Complications: No notable events documented.

## 2022-03-22 NOTE — Anesthesia Procedure Notes (Signed)
Procedure Name: Intubation Date/Time: 03/22/2022 7:51 AM  Performed by: Manning Charity, CRNAPre-anesthesia Checklist: Patient identified, Emergency Drugs available, Suction available and Patient being monitored Patient Re-evaluated:Patient Re-evaluated prior to induction Oxygen Delivery Method: Circle system utilized Preoxygenation: Pre-oxygenation with 100% oxygen Induction Type: IV induction Ventilation: Mask ventilation without difficulty Laryngoscope Size: McGraph and 3 Grade View: Grade II Tube type: Oral Tube size: 7.0 mm Number of attempts: 1 Airway Equipment and Method: Stylet and Oral airway Placement Confirmation: ETT inserted through vocal cords under direct vision, positive ETCO2 and breath sounds checked- equal and bilateral Secured at: 22 cm Tube secured with: Tape Dental Injury: Teeth and Oropharynx as per pre-operative assessment

## 2022-03-22 NOTE — H&P (Signed)
Paper H&P to be scanned into permanent record. H&P reviewed. No significant changes noted.  

## 2022-03-23 ENCOUNTER — Encounter: Payer: Self-pay | Admitting: Orthopedic Surgery

## 2022-03-23 DIAGNOSIS — M25851 Other specified joint disorders, right hip: Secondary | ICD-10-CM | POA: Diagnosis not present

## 2022-03-23 MED ORDER — NAPROXEN 500 MG PO TABS: 500.0000 mg | ORAL_TABLET | Freq: Two times a day (BID) | ORAL | 1 refills | Status: DC

## 2022-03-23 MED ORDER — ASPIRIN 325 MG PO TABS
325.0000 mg | ORAL_TABLET | Freq: Every day | ORAL | Status: DC
  Administered 2022-03-23: 325 mg via ORAL
  Filled 2022-03-23: qty 1

## 2022-03-23 MED ORDER — ONDANSETRON HCL 4 MG PO TABS: 4.0000 mg | ORAL_TABLET | Freq: Four times a day (QID) | ORAL | 0 refills | Status: AC | PRN

## 2022-03-23 MED ORDER — METHOCARBAMOL 500 MG PO TABS: 500.0000 mg | ORAL_TABLET | Freq: Four times a day (QID) | ORAL | 0 refills | Status: DC | PRN

## 2022-03-23 MED ORDER — OXYCODONE HCL 5 MG PO TABS: 5.0000 mg | ORAL_TABLET | ORAL | 0 refills | Status: DC | PRN

## 2022-03-23 MED ORDER — ASPIRIN 325 MG PO TABS: 325.0000 mg | ORAL_TABLET | Freq: Every day | ORAL | 0 refills | Status: DC

## 2022-03-23 NOTE — Evaluation (Signed)
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 Pt taken to OR by Dr. Signa Kell, postoperatively is "flatfoot weightbearing with hip abduction brace x3 weeks."  Clinical Impression  Pt admitted with above Dx. Pt has functional limitations due to deficits below (see "PT Problem List"). Pt able to provide details on baseline functional status. Educational interventions on DME and precautions reviewed. Today pt able to perform bed mobility, transfers, and short distance walking, stairs with modified independence after some instruction. Husband in attendance for all educational interventions. Patient's performance this date reveals decreased ability, independence, and tolerance in performing all basic mobility required for performance of activities of daily living, but is able to be safe and confident with AC and RW and her hip brace. Pt requires additional DME, close physical assistance, and cues for safe participate in mobility. Pt will benefit from skilled PT intervention to increase independence and safety with basic mobility in preparation for discharge to the venue listed below.     No data found.        Recommendations for follow up therapy are one component of a multi-disciplinary discharge planning process, led by the attending physician.  Recommendations may be updated based on patient status, additional functional criteria and insurance authorization.  Follow Up Recommendations Follow physician's recommendations for discharge plan and follow up therapies      Assistance Recommended at Discharge    Patient can return home with the following  Help with stairs or ramp for entrance;Assist for transportation;Assistance with cooking/housework;A little help with  bathing/dressing/bathroom    Equipment Recommendations Crutches  Recommendations for Other Services       Functional Status Assessment Patient has had a recent decline in their functional status and demonstrates the ability to make significant improvements in function in a reasonable and predictable amount of time.     Precautions / Restrictions Precautions Precautions: Fall Required Braces or Orthoses: Other Brace Other Brace: hip abduction brace Restrictions Weight Bearing Restrictions: Yes LLE Weight Bearing: Touchdown weight bearing Other Position/Activity Restrictions: FFWBing, hip abduction brace x3wks      Mobility  Bed Mobility Overal bed mobility: Modified Independent                  Transfers Overall transfer level: Needs assistance Equipment used: Rolling walker (2 wheels) Transfers: Sit to/from Stand Sit to Stand: From elevated surface, Modified independent (Device/Increase time)                Ambulation/Gait Ambulation/Gait assistance: Modified independent (Device/Increase time) Gait Distance (Feet): 100 Feet Assistive device: Rolling walker (2 wheels), Crutches Gait Pattern/deviations: Step-to pattern          Stairs Stairs: Yes Stairs assistance: Min guard, Supervision Stair Management: Backwards, Step to pattern, With crutches, With walker Number of Stairs: 6    Wheelchair Mobility    Modified Rankin (Stroke Patients Only)       Balance                                             Pertinent Vitals/Pain      Home Living Family/patient expects to be discharged to:: Private residence Living Arrangements: Spouse/significant other;Children;Other (  Comment) (4 great danes) Available Help at Discharge: Family;Available 24 hours/day Type of Home: House Home Access: Stairs to enter   [employer] of Steps: 2   Home Layout: One level Home Equipment: Adaptive equipment;Shower seat - built in;Other  (comment) Additional Comments: adjustable bed    Prior Function Prior Level of Function : Driving;Working/employed;Independent/Modified Independent             Mobility Comments: indep ADLs Comments: indep, works as a Designer, television/film set Extremity Assessment Upper Extremity Assessment: Overall WFL for tasks assessed    Lower Extremity Assessment Lower Extremity Assessment: LLE deficits/detail LLE Deficits / Details: s/p L hip surgery LLE: Unable to fully assess due to immobilization;Unable to fully assess due to pain    Cervical / Trunk Assessment Cervical / Trunk Assessment: Normal  Communication      Cognition                                                General Comments      Exercises     Assessment/Plan    PT Assessment Patient needs continued PT services  PT Problem List Decreased range of motion;Decreased activity tolerance;Decreased balance;Decreased mobility;Decreased safety awareness;Decreased knowledge of precautions;Decreased knowledge of use of DME       PT Treatment Interventions DME instruction;Balance training;Gait training;Stair training;Functional mobility training;Therapeutic activities;Therapeutic exercise;Patient/family education    PT Goals (Current goals can be found in the Care Plan section)  Acute Rehab PT Goals Patient Stated Goal: DC to home and be safe PT Goal Formulation: With patient Time For Goal Achievement: 04/06/22 Potential to Achieve Goals: Good    Frequency 7X/week     Co-evaluation               AM-PAC PT "6 Clicks" Mobility  Outcome Measure Help needed turning from your back to your side while in a flat bed without using bedrails?: None Help needed moving from lying on your back to sitting on the side of a flat bed without using bedrails?: None Help needed moving to and from a bed to a chair (including a wheelchair)?:  None Help needed standing up from a chair using your arms (e.g., wheelchair or bedside chair)?: None Help needed to walk in hospital room?: None Help needed climbing 3-5 steps with a railing? : A Little 6 Click Score: 23    End of Session Equipment Utilized During Treatment: Gait belt Activity Tolerance: Patient tolerated treatment well Patient left: in bed;with call bell/phone within reach;with family/visitor present   PT Visit Diagnosis: Unsteadiness on feet (R26.81);Difficulty in walking, not elsewhere classified (R26.2);Other abnormalities of gait and mobility (R26.89)    Time: 7829-5621 PT Time Calculation (min) (ACUTE ONLY): 31 min   Charges:   PT Evaluation $PT Eval Low Complexity: 1 Low PT Treatments $Gait Training: 23-37 mins       11:54 AM, 03/23/22 Rosamaria Lints, PT, DPT Physical Therapist - [facility]  [phone] (ASCOM)    Hosam Mcfetridge C 03/23/2022, 11:50 AM

## 2022-03-23 NOTE — Discharge Summary (Signed)
Physician Discharge Summary  Subjective: 1 Day Post-Op Procedure(s) (LRB): Left hip arthroscopy, acetabuloplasty, labral repair, femoral osteochondroplasty, capsular closure (Left) Patient reports pain as mild.   Patient seen in rounds with Dr. Allena Katz. Patient is well, and has had no acute complaints or problems Patient is ready to go home after physical therapy  Physician Discharge Summary  Patient ID: Amber Reed MRN: [id number] DOB/AGE: Oct 01, 1980 41 y.o.  Admit date: 03/22/2022 Discharge date: 03/23/2022  Admission Diagnoses:  Discharge Diagnoses:  Principal Problem:   Femoroacetabular impingement of left hip   Discharged Condition: good  Hospital Course: The patient is postop day 1 from a left hip arthroscopy.  She is doing very well in the room with getting to the bathroom and transferring.  She is using her hip brace for protection.  She has minimal pain.  Her vitals are stable.  Treatments: surgery:  1. Left hip arthroscopy with acetabuloplasty, labral repair, femoral osteochondroplasty, and capsular closure   SURGEON: Rosealee Albee, MD   ASSISTANT: Dedra Skeens, PA; Rayburn Go, PA-S    ANESTHESIA: Gen   ESTIMATED BLOOD LOSS: minimal   TOTAL IV FLUIDS: per anesthesia    Discharge Exam: Blood pressure 106/68, pulse 98, temperature 98.4 F (36.9 C), resp. rate 17, height 5\' 11"  (1.803 m), weight 68 kg, SpO2 99 %.   Disposition: Discharge disposition: 01-Home or Self Care        Allergies as of 03/23/2022       Reactions   Triptans    Itchy throat (migraine medications)   Amoxicillin Other (See Comments)   Scratchy throat        Medication List     TAKE these medications    Ajovy 225 MG/1.5ML Sosy Generic drug: Fremanezumab-vfrm Inject 1 Dose into the skin every 30 (thirty) days.   aspirin 325 MG tablet Take 1 tablet (325 mg total) by mouth daily.   buPROPion 300 MG 24 hr tablet Commonly known as: WELLBUTRIN XL Take 300 mg by  mouth at bedtime.   clonazePAM 0.5 MG tablet Commonly known as: KLONOPIN Take 0.5 mg by mouth daily as needed.   Cyanocobalamin-Methylcobalamin 600-600 MCG Subl Place 1 tablet under the tongue daily.   hydrochlorothiazide 25 MG tablet Commonly known as: HYDRODIURIL Take 25 mg by mouth daily.   meclizine 25 MG tablet Commonly known as: ANTIVERT Take 1 tablet by mouth as needed.   methimazole 5 MG tablet Commonly known as: TAPAZOLE Take 5 mg by mouth at bedtime.   methocarbamol 500 MG tablet Commonly known as: ROBAXIN Take 1 tablet (500 mg total) by mouth every 6 (six) hours as needed for muscle spasms.   Mirena (52 MG) 20 MCG/DAY Iud Generic drug: levonorgestrel 1 each by Intrauterine route once.   Multi-Vitamin tablet Take 1 tablet by mouth daily.   naproxen 500 MG tablet Commonly known as: NAPROSYN Take 1 tablet (500 mg total) by mouth 2 (two) times daily with a meal.   ondansetron 4 MG disintegrating tablet Commonly known as: ZOFRAN-ODT Take 4 mg by mouth every 6 (six) hours as needed for nausea or vomiting.   ondansetron 4 MG tablet Commonly known as: ZOFRAN Take 1 tablet (4 mg total) by mouth every 6 (six) hours as needed for nausea.   oxyCODONE 5 MG immediate release tablet Commonly known as: Oxy IR/ROXICODONE Take 1-2 tablets (5-10 mg total) by mouth every 4 (four) hours as needed for moderate pain (pain score 4-6).   potassium chloride SA 20 MEQ tablet  Commonly known as: KLOR-CON M Take 20 mEq by mouth daily.   Ubrelvy 100 MG Tabs Generic drug: Ubrogepant Take 1 tablet by mouth daily as needed.        Follow-up Information     Signa Kell, MD. Go in 2 week(s).   Specialty: Orthopedic Surgery Why: Postoperative care Contact information: [address] [place] [address] [phone]                 Signed: Lenard Forth, Milfred Krammes 03/23/2022, 7:36 AM   Objective: Vital signs in last 24 hours: Temp:  [97.2 F (36.2 C)-99.3 F  (37.4 C)] 98.4 F (36.9 C) (09/12 0149) Pulse Rate:  [95-107] 98 (09/12 0149) Resp:  [11-18] 17 (09/12 0149) BP: (104-115)/(55-70) 106/68 (09/12 0149) SpO2:  [98 %-100 %] 99 % (09/12 0149) Weight:  [68 kg] 68 kg (09/11 1622)  Intake/Output from previous day:  Intake/Output Summary (Last 24 hours) at 03/23/2022 0736 Last data filed at 03/23/2022 6237 Gross per 24 hour  Intake 2791.91 ml  Output 750 ml  Net 2041.91 ml    Intake/Output this shift: No intake/output data recorded.  Labs: No results for input(s): "HGB" in the last 72 hours. No results for input(s): "WBC", "RBC", "HCT", "PLT" in the last 72 hours. No results for input(s): "NA", "K", "CL", "CO2", "BUN", "CREATININE", "GLUCOSE", "CALCIUM" in the last 72 hours. No results for input(s): "LABPT", "INR" in the last 72 hours.  EXAM: General - Patient is Alert and Oriented Extremity - Sensation intact distally Dorsiflexion/Plantar flexion intact Compartment soft Incision - clean, dry, no drainage with the abduction brace in place. Motor Function -plantarflexion and dorsiflexion intact.  Assessment/Plan: 1 Day Post-Op Procedure(s) (LRB): Left hip arthroscopy, acetabuloplasty, labral repair, femoral osteochondroplasty, capsular closure (Left) Procedure(s) (LRB): Left hip arthroscopy, acetabuloplasty, labral repair, femoral osteochondroplasty, capsular closure (Left) Past Medical History:  Diagnosis Date   Anemia    Anxiety    Endometriosis    Factor 5 Leiden mutation, heterozygous (HCC)    Meniere disease    Migraine    Osteoarthritis of left hip    PCO (polycystic ovaries)    PONV (postoperative nausea and vomiting)    Shingles outbreak 09/2018   Spondylosis    SVT (supraventricular tachycardia) (HCC) 2014   Thyroiditis    Principal Problem:   Femoroacetabular impingement of left hip  Estimated body mass index is 20.92 kg/m as calculated from the following:   Height as of this encounter: 5\' 11"  (1.803 m).    Weight as of this encounter: 68 kg. Advance diet Up with therapy D/C IV fluids Diet - Regular diet Follow up - in 2 weeks Activity -flatfoot weightbearing Disposition - Home Condition Upon Discharge - Stable DVT Prophylaxis - Aspirin  , PA-C Orthopaedic Surgery 03/23/2022, 7:36 AM'

## 2022-03-23 NOTE — Progress Notes (Signed)
  Subjective: 1 Day Post-Op Procedure(s) (LRB): Left hip arthroscopy, acetabuloplasty, labral repair, femoral osteochondroplasty, capsular closure (Left) Patient reports pain as mild.   Patient is well, and has had no acute complaints or problems Plan is to go Home after hospital stay. Negative for chest pain and shortness of breath Fever: no Gastrointestinal:Negative for nausea and vomiting  Objective: Vital signs in last 24 hours: Temp:  [97.2 F (36.2 C)-99.3 F (37.4 C)] 98.4 F (36.9 C) (09/12 0149) Pulse Rate:  [95-107] 98 (09/12 0149) Resp:  [11-18] 17 (09/12 0149) BP: (104-115)/(55-70) 106/68 (09/12 0149) SpO2:  [98 %-100 %] 99 % (09/12 0149) Weight:  [68 kg] 68 kg (09/11 1622)  Intake/Output from previous day:  Intake/Output Summary (Last 24 hours) at 03/23/2022 0727 Last data filed at 03/23/2022 8347 Gross per 24 hour  Intake 2791.91 ml  Output 750 ml  Net 2041.91 ml    Intake/Output this shift: No intake/output data recorded.  Labs: No results for input(s): "HGB" in the last 72 hours. No results for input(s): "WBC", "RBC", "HCT", "PLT" in the last 72 hours. No results for input(s): "NA", "K", "CL", "CO2", "BUN", "CREATININE", "GLUCOSE", "CALCIUM" in the last 72 hours. No results for input(s): "LABPT", "INR" in the last 72 hours.   EXAM General - Patient is Alert and Oriented Extremity - Neurovascular intact Sensation intact distally Dorsiflexion/Plantar flexion intact Compartment soft Dressing/Incision - clean, dry, no drainage Motor Function - intact, moving foot and toes well on exam.   Past Medical History:  Diagnosis Date   Anemia    Anxiety    Endometriosis    Factor 5 Leiden mutation, heterozygous (HCC)    Meniere disease    Migraine    Osteoarthritis of left hip    PCO (polycystic ovaries)    PONV (postoperative nausea and vomiting)    Shingles outbreak 09/2018   Spondylosis    SVT (supraventricular tachycardia) (HCC) 2014   Thyroiditis      Assessment/Plan: 1 Day Post-Op Procedure(s) (LRB): Left hip arthroscopy, acetabuloplasty, labral repair, femoral osteochondroplasty, capsular closure (Left) Principal Problem:   Femoroacetabular impingement of left hip  Estimated body mass index is 20.92 kg/m as calculated from the following:   Height as of this encounter: 5\' 11"  (1.803 m).   Weight as of this encounter: 68 kg. Advance diet Up with therapy D/C IV fluids  DVT Prophylaxis - Aspirin, Foot Pumps, and TED hose Flatfoot Weight-Bearing to left leg  , PA-C Orthopaedic Surgery 03/23/2022, 7:27 AM

## 2022-03-23 NOTE — Progress Notes (Signed)
Met with the patient and her husband in the room She will get crutches and a BSC to take home thru [employer] She has Outpatient PT set up and has an appointment  Her husband will transport

## 2022-03-23 NOTE — Plan of Care (Signed)

## 2022-03-23 NOTE — Evaluation (Signed)
Occupational Therapy Evaluation Patient Details Name: Amber Reed MRN: [id number] DOB: [birth year] Today's Date: 03/23/2022   History of Present Illness 41yo female s/p left hip arthroscopy with acetabuloplasty, labral repair, femoral osteochondroplasty, and capsular closure on 03/22/22.   Clinical Impression   Pt was seen for OT evaluation this date. 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 Pt lives with her spouse, 17yo son, and 4 great danes in a single story home with 2 STE. Pt reports having adequate assist at home available. Pt presents to acute OT demonstrating impaired ADL performance and functional mobility 2/2 L hip pain and decreased strength and ROM (See OT problem list). Pt currently requires supv for ADL transfers and mobility with RW. Pt tends to maintain NWBing vs FFWBing with RW throughout, as she notes she is tempted to put weight through her LLE otherwise. Pt currently requires MIN-MOD A for LB ADL tasks and supv/mod indep with toilet transfers (BSC frame over toilet). Pt/spouse instructed in precautions and how to maintain during ADL/mobility, falls prevention, polar care mgt, adjusted brace for optimal fit/comfort, pet care considerations, home/routines modifications, and AE/DME for ADL. Handout provided to support recall and carryover. Pt/spouse verbalized understanding. Pt would benefit from skilled OT services while hospitalized to address noted impairments and functional limitations (see below for any additional details) in order to maximize safety and independence while minimizing falls risk and caregiver burden. Do not anticipate skilled OT needs upon discharge.    Recommendations for follow up therapy are one component of a multi-disciplinary discharge planning process, led by the attending physician.  Recommendations may be updated based on patient status, additional functional criteria and insurance authorization.   Follow Up  Recommendations  No OT follow up    Assistance Recommended at Discharge PRN  Patient can return home with the following A little help with bathing/dressing/bathroom;Assistance with cooking/housework;Help with stairs or ramp for entrance    Functional Status Assessment  Patient has had a recent decline in their functional status and demonstrates the ability to make significant improvements in function in a reasonable and predictable amount of time.  Equipment Recommendations  BSC/3in1    Recommendations for Other Services       Precautions / Restrictions Precautions Precautions: Fall Required Braces or Orthoses: Other Brace Other Brace: hip abduction brace Restrictions Weight Bearing Restrictions: Yes LLE Weight Bearing: Touchdown weight bearing Other Position/Activity Restrictions: FFWBing, hip abduction brace x3wks      Mobility Bed Mobility Overal bed mobility: Modified Independent             General bed mobility comments: cautious, instructed in strategies    Transfers Overall transfer level: Needs assistance Equipment used: Rolling walker (2 wheels) Transfers: Sit to/from Stand Sit to Stand: Supervision, From elevated surface           General transfer comment: elevated bed to mimic home set up, maintains FFWB versus NWBing throughout      Balance Overall balance assessment: Mild deficits observed, not formally tested                                         ADL either performed or assessed with clinical judgement   ADL  General ADL Comments: Pt currently requires MIN-MOD A for LB ADL tasks without AE in order to maintain precautions. Indep with UB ADL. Per pt, has reacher and family to assist as needed. Supv for toilet transfer with RW and BSC over toilet.     Vision         Perception     Praxis      Pertinent Vitals/Pain Pain Assessment Pain Assessment: 0-10 Pain  Score: 7  Pain Location: 4-5/10 L hip at rest, increasing with mobility Pain Descriptors / Indicators: Aching Pain Intervention(s): Limited activity within patient's tolerance, Monitored during session, Repositioned, Ice applied, Patient requesting pain meds-RN notified     Hand Dominance     Extremity/Trunk Assessment Upper Extremity Assessment Upper Extremity Assessment: Overall WFL for tasks assessed   Lower Extremity Assessment Lower Extremity Assessment: LLE deficits/detail LLE Deficits / Details: s/p L hip surgery LLE: Unable to fully assess due to immobilization;Unable to fully assess due to pain   Cervical / Trunk Assessment Cervical / Trunk Assessment: Normal   Communication     Cognition Arousal/Alertness: Awake/alert Behavior During Therapy: WFL for tasks assessed/performed Overall Cognitive Status: Within Functional Limits for tasks assessed                                       General Comments       Exercises Other Exercises Other Exercises: Pt/spouse instructed in precautions and how to maintain during ADL/mobility, falls prevention, polar care mgt, adjusted brace for optimal fit/comfort, pet care considerations, home/routines modifications, and AE/DME for ADL. Handout provided.   Shoulder Instructions      Home Living Family/patient expects to be discharged to:: Private residence Living Arrangements: Spouse/significant other;Children;Other (Comment) (4 great danes) Available Help at Discharge: Family;Available 24 hours/day Type of Home: House Home Access: Stairs to enter [employer] of Steps: 2   Home Layout: One level     Bathroom Shower/Tub: Producer, television/film/video: Handicapped height     Home Equipment: Merchant navy officer;Shower seat - built in;Other (comment) Adaptive Equipment: Reacher Additional Comments: adjustable bed      Prior Functioning/Environment Prior Level of Function :  Driving;Working/employed;Independent/Modified Independent             Mobility Comments: indep ADLs Comments: indep, works as a Probation officer List: Decreased strength;Decreased range of motion;Pain;Decreased knowledge of use of DME or AE;Decreased knowledge of precautions      OT Treatment/Interventions: Self-care/ADL training;Therapeutic exercise;Therapeutic activities;DME and/or AE instruction;Patient/family education    OT Goals(Current goals can be found in the care plan section) Acute Rehab OT Goals Patient Stated Goal: maximize independence OT Goal Formulation: With patient/family Time For Goal Achievement: 04/06/22 Potential to Achieve Goals: Good ADL Goals Pt Will Perform Lower Body Dressing: with modified independence;sit to/from stand (maintaining precautions) Additional ADL Goal #1: Pt/spouse will demo independence wiht brace mgt Additional ADL Goal #2: Pt/spouse will demo independence with polar care mgt  OT Frequency: Min 2X/week    Co-evaluation              AM-PAC OT "6 Clicks" Daily Activity     Outcome Measure Help from another person eating meals?: None Help from another person taking care of personal grooming?: None Help from another person toileting, which includes using toliet, bedpan, or urinal?: None Help from another person bathing (including washing, rinsing,  drying)?: A Little Help from another person to put on and taking off regular upper body clothing?: None Help from another person to put on and taking off regular lower body clothing?: A Little 6 Click Score: 22   End of Session Equipment Utilized During Treatment: Rolling walker (2 wheels);Other (comment) (hip abd brace) Nurse Communication: Patient requests pain meds  Activity Tolerance: Patient tolerated treatment well Patient left: in bed;with call bell/phone within reach;with family/visitor present;with nursing/sitter in room  OT Visit Diagnosis: Other  abnormalities of gait and mobility (R26.89);Pain Pain - Right/Left: Left Pain - part of body: Hip                Time: [id number] OT Time Calculation (min): 25 min Charges:  OT General Charges $OT Visit: 1 Visit OT Evaluation $OT Eval Low Complexity: 1 Low OT Treatments $Self Care/Home Management : 8-22 mins  Ardeth Perfect., MPH, MS, OTR/L ascom [phone] 03/23/22, 9:37 AM

## 2022-03-23 NOTE — Discharge Instructions (Signed)
Hip Arthroscopy Post-Operative Instructions  1. Physical Therapy should start within 3-4 days of surgery. If your therapist has ANY questions, please ask them to call our offices. 2. If oozing from surgery site occurs, and the dressing appears soaked with bloody fluid please change the dressing as needed. This normally occurs after fluid irrigation during surgery, and will resolve within 24-36 hours. 3. Icing is very important for the first 5-7 days postoperative, and ice is applied (ice packs or ice therapy) as often as possible or at least for 20-minute periods 3-4 times per day. Ice should not be applied directly on the skin. 4. Physical therapist will remove dressing at 1st PT visit. 5. Apply Band-Aids to wound sites and change them once a day. Keep the wound clean and dry. 6. Please do not use bacitracin or other ointments under the bandage. 7. Showering is allowed on post-op day #4 if the wound is dry. MAKE SURE EACH INCISION IS COVERED WITH A WATERPROOF BANDAID DURING SHOWER ONLY! 8. Do not soak the hip in water in a bathtub or pool until the sutures are removed. Typically getting into a bath or pool is permitted 4 weeks after surgery.  9. Driving is permitted after 1 week for L hip surgery only if the narcotic pain medication is no longer being taken and you feel comfortable getting into and out of a car. For R hip surgery, driving is permitted after 2 weeks. Driving a manual car may take up to 3-4 weeks. 10. Please ensure you have a follow-up appointment for suture removal ~2 weeks after surgery.  11. The anesthetic drugs used during your surgery may cause nausea for the first 24 hours. If nausea is encountered, drink only clear liquids (i.e. Sprite or 7-up). The only solids should be dry crackers or toast. If nausea and vomiting become severe or the patient shows sign of dehydration (lack of urination) please call the doctor or the surgicenter. 12. If you develop a fever (101.5), redness, or  yellow/brown/green drainage from the surgical incision site, please call our office to arrange for an evaluation.  13: POST-OPERATIVE PRESCRIPTIONS:  HETERTOPIC BONE PROPHYLAXIS FOR 30 DAYS: 1. EC-Naprosyn 500mg, 1 tablet by mouth two times per day x 30 days 2. Prilosec (Stomach Prophylaxis) 20mg, 1 tablet by mouth daily (take on an empty stomach x 30 days  DVT PROPHYLAXIS 3. Aspirin 325mg by mouth daily x 2 weeks  PAIN MEDICATION:  4. Oxycodone 1 to 3 tablets by mouth every 4 hours as needed 5. Tylenol 1000mg three times day for at least 5 days, then as needed to reduce narcotics  ANTI-NAUSEA (if applicable):  6. Zofran 4mg tablet, 1 tablet every 6 hours as needed. You will be given a prescription, but it is optional to fill it.  ANTI-SPASM (if applicable):  7. Zanaflex 4mg, 2 tablets by mouth every 6 hours as needed.  ANTI-CONSTIPATION 8. Colace 100mg, 2 tablets by mouth daily (to prevent constipation with use of narcotic medications)  14. You will take as aspirin (325 mg) daily x 2 weeks. This may lower the risk of a blood clot developing after surgery. Should severe calf pain occur or significant swelling of calf and ankle, please call our offices. 15. Local anesthetics (i.e. Novocaine) are put into the incision after surgery. It is not uncommon for patients to encounter more pain on the first or second day after surgery. This is the time when swelling peaks. Taking pain medication before bedtime will assist in sleeping. It   is important not to drink or drive while taking narcotic medication. You should resume your normal medications for other conditions the day after surgery. 16. Follow weight bearing instructions as advised at discharge. Crutches may be necessary to assist walking. Extremity elevation for the first 72 hours is also encouraged to minimize swelling. 17. If unexpected problems occur and you need to speak to the doctor, call the office.   Important Contact  Information Roselia Fuentes (Staff Assistant): [phone] Fax Number: [phone]    

## 2022-03-23 NOTE — Progress Notes (Cosign Needed)
Patient is not able to walk the distance required to go the bathroom, or he/she is unable to safely negotiate stairs required to access the bathroom.  A 3in1 BSC will alleviate this problem  

## 2022-03-23 NOTE — Plan of Care (Signed)
  Problem: Education: Goal: Knowledge of General Education information will improve Description: Including pain rating scale, medication(s)/side effects and non-pharmacologic comfort measures 03/23/2022 1123 by Arless Vineyard Bet, LPN Outcome: Adequate for Discharge 03/23/2022 0957 by Loletha Bertini Bet, LPN Outcome: Progressing   Problem: Health Behavior/Discharge Planning: Goal: Ability to manage health-related needs will improve 03/23/2022 1123 by Tyan Dy Bet, LPN Outcome: Adequate for Discharge 03/23/2022 0957 by Retia Cordle Bet, LPN Outcome: Progressing   Problem: Clinical Measurements: Goal: Ability to maintain clinical measurements within normal limits will improve 03/23/2022 1123 by Mona Ayars Bet, LPN Outcome: Adequate for Discharge 03/23/2022 0957 by Nic Lampe Bet, LPN Outcome: Progressing Goal: Will remain free from infection 03/23/2022 1123 by Jannette Cotham Bet, LPN Outcome: Adequate for Discharge 03/23/2022 0957 by Yariah Selvey Bet, LPN Outcome: Progressing Goal: Diagnostic test results will improve 03/23/2022 1123 by Luan Urbani Bet, LPN Outcome: Adequate for Discharge 03/23/2022 0957 by Tianna Baus Bet, LPN Outcome: Progressing Goal: Respiratory complications will improve 03/23/2022 1123 by Lief Palmatier Bet, LPN Outcome: Adequate for Discharge 03/23/2022 0957 by Jacai Kipp Bet, LPN Outcome: Progressing Goal: Cardiovascular complication will be avoided 03/23/2022 1123 by Olga Bourbeau Bet, LPN Outcome: Adequate for Discharge 03/23/2022 0957 by Leonna Schlee Bet, LPN Outcome: Progressing   Problem: Activity: Goal: Risk for activity intolerance will decrease 03/23/2022 1123 by Kaula Klenke Bet, LPN Outcome: Adequate for Discharge 03/23/2022 0957 by Annalysse Shoemaker Bet, LPN Outcome: Progressing   Problem: Nutrition: Goal: Adequate nutrition will be maintained 03/23/2022 1123 by Meadow Abramo Bet, LPN Outcome: Adequate for Discharge 03/23/2022 0957 by Tonna Palazzi Bet, LPN Outcome: Progressing    Problem: Coping: Goal: Level of anxiety will decrease 03/23/2022 1123 by Lyndon Chapel Bet, LPN Outcome: Adequate for Discharge 03/23/2022 0957 by Micaella Gitto Bet, LPN Outcome: Progressing   Problem: Elimination: Goal: Will not experience complications related to bowel motility 03/23/2022 1123 by Roisin Mones Bet, LPN Outcome: Adequate for Discharge 03/23/2022 0957 by Kimball Manske Bet, LPN Outcome: Progressing Goal: Will not experience complications related to urinary retention 03/23/2022 1123 by Genesis Novosad Bet, LPN Outcome: Adequate for Discharge 03/23/2022 0957 by Venida Tsukamoto Bet, LPN Outcome: Progressing   Problem: Pain Managment: Goal: General experience of comfort will improve 03/23/2022 1123 by Avice Funchess Bet, LPN Outcome: Adequate for Discharge 03/23/2022 0957 by Lalani Winkles Bet, LPN Outcome: Progressing   Problem: Safety: Goal: Ability to remain free from injury will improve 03/23/2022 1123 by Seira Cody Bet, LPN Outcome: Adequate for Discharge 03/23/2022 0957 by Allahna Husband Bet, LPN Outcome: Progressing   Problem: Skin Integrity: Goal: Risk for impaired skin integrity will decrease 03/23/2022 1123 by Marifer Hurd Bet, LPN Outcome: Adequate for Discharge 03/23/2022 0957 by Donnamae Muilenburg Bet, LPN Outcome: Progressing   Problem: Acute Rehab OT Goals (only OT should resolve) Goal: Pt. Will Perform Lower Body Dressing Outcome: Adequate for Discharge Goal: OT Additional ADL Goal #1 Outcome: Adequate for Discharge Goal: OT Additional ADL Goal #2 Outcome: Adequate for Discharge

## 2022-03-23 NOTE — Progress Notes (Signed)
Discharge Note: Reviewed discharge instructions with pt. Pt verbalized understanding. Pt discharged with a polar care, assistive equipment, and all personal belongings. Staff wheeled pt out. Pt transported to home via family vehicle.

## 2022-03-24 LAB — HIV ANTIBODY (ROUTINE TESTING W REFLEX): HIV Screen 4th Generation wRfx: NONREACTIVE

## 2022-04-26 ENCOUNTER — Ambulatory Visit: Payer: 59 | Admitting: Urology

## 2022-04-26 VITALS — BP 115/39 | HR 80 | Ht 71.0 in | Wt 150.0 lb

## 2022-04-26 DIAGNOSIS — R339 Retention of urine, unspecified: Secondary | ICD-10-CM | POA: Diagnosis not present

## 2022-04-26 LAB — URINALYSIS, COMPLETE
Bilirubin, UA: NEGATIVE
Glucose, UA: NEGATIVE
Ketones, UA: NEGATIVE
Leukocytes,UA: NEGATIVE
Nitrite, UA: NEGATIVE
Protein,UA: NEGATIVE
RBC, UA: NEGATIVE
Specific Gravity, UA: 1.015 (ref 1.005–1.030)
Urobilinogen, Ur: 0.2 mg/dL (ref 0.2–1.0)
pH, UA: 8.5 — ABNORMAL HIGH (ref 5.0–7.5)

## 2022-04-26 LAB — MICROSCOPIC EXAMINATION: Bacteria, UA: NONE SEEN

## 2022-04-26 LAB — BLADDER SCAN AMB NON-IMAGING: Scan Result: 22

## 2022-04-26 MED ORDER — TAMSULOSIN HCL 0.4 MG PO CAPS: 0.4000 mg | ORAL_CAPSULE | Freq: Every day | ORAL | 11 refills | Status: DC

## 2022-04-26 NOTE — Progress Notes (Signed)
04/26/2022 2:51 PM   Jeral Pinch October 14, 1980 [id number]  Referring provider: Mick Sell, MD [address] [place],  [place] [address]  No chief complaint on file.   HPI: I was consulted to assess the patient for recurrent bladder infections and significant flow symptoms  She recently had burning and flow symptoms with a positive urinalysis and negative culture but she got clinically better on ciprofloxacin.  For 18 months her flow is poor.  She has to push to urinate especially at the end.  If she did not strain significantly she probably could only void about 50%.  She stops and starts.  She does not feel empty.  She was told she had a large bladder on the CT scan.  She was told that she had a reddened bladder during a recent hysterectomy.  She had low back surgery several months ago but her flow symptoms started prior to this  She has uncommon stress incontinence coughing sneezing but not on every occasion.  She has very rare to no urge incontinence.  No bedwetting.  Does not wear pads  She voids every 1-2 hours but usually hourly.  She gets up once a night  She think she has had 5 or 6 infections in the last year  No bladder surgery kidney stones  The patient has been seen at [facility] for pelvic pain and endometriosis.  She had a pelvic ultrasound on February 23, 2022 which showed a normal bladder and a postvoid residual of 24 mL and a prevoid volume of 120 mL.  She has a lot of medical issues including a clotting disorder factor V Leiden, pression, migraines and neurogenic pain, thyroid disease has had multiple surgeries.  Uses hormone supplements to ensure elevated testosterone.  Her hysterectomy was for endometriosis.  A CT scan at [facility] February 03, 2022 demonstrated partially duplicated right kidney but no hydronephrosis.  Distended bladder.  During the patient's hysterectomy she had cystoscopy noted to be normal.  At [facility] she was consulted to physical therapy  for pelvic pain.  Did not see obvious positive cultures     PMH: Past Medical History:  Diagnosis Date   Anemia    Anxiety    Endometriosis    Factor 5 Leiden mutation, heterozygous (HCC)    Meniere disease    Migraine    Osteoarthritis of left hip    PCO (polycystic ovaries)    PONV (postoperative nausea and vomiting)    Shingles outbreak 09/2018   Spondylosis    SVT (supraventricular tachycardia) (HCC) 2014   Thyroiditis     Surgical History: Past Surgical History:  Procedure Laterality Date   ABDOMINOPLASTY  2008   CARDIAC ELECTROPHYSIOLOGY STUDY AND ABLATION  2014   CESAREAN SECTION  2006   HERNIA REPAIR  2008   umbilical   HIP ARTHROSCOPY Left 03/22/2022   Procedure: Left hip arthroscopy, acetabuloplasty, labral repair, femoral osteochondroplasty, capsular closure;  Surgeon: Signa Kell, MD;  Location: [facility] ORS;  Service: Orthopedics;  Laterality: Left;   LIPOSUCTION TRUNK  2021   MASTOPEXY Bilateral 2022   NASAL SINUS SURGERY  2003   PELVIC LAPAROSCOPY  2018   endometriosis adhesions   POSTERIOR LUMBAR FUSION  06/11/2021   L5-S1    Home Medications:  Allergies as of 04/26/2022       Reactions   Triptans    Itchy throat (migraine medications)   Amoxicillin Other (See Comments)   Scratchy throat  Medication List        Accurate as of April 26, 2022  2:51 PM. If you have any questions, ask your nurse or doctor.          Ajovy 225 MG/1.5ML Sosy Generic drug: Fremanezumab-vfrm Inject 1 Dose into the skin every 30 (thirty) days.   aspirin 325 MG tablet Take 1 tablet (325 mg total) by mouth daily.   buPROPion 300 MG 24 hr tablet Commonly known as: WELLBUTRIN XL Take 300 mg by mouth at bedtime.   clonazePAM 0.5 MG tablet Commonly known as: KLONOPIN Take 0.5 mg by mouth daily as needed.   Cyanocobalamin-Methylcobalamin 600-600 MCG Subl Place 1 tablet under the tongue daily.   hydrochlorothiazide 25 MG tablet Commonly known as:  HYDRODIURIL Take 25 mg by mouth daily.   meclizine 25 MG tablet Commonly known as: ANTIVERT Take 1 tablet by mouth as needed.   methimazole 5 MG tablet Commonly known as: TAPAZOLE Take 5 mg by mouth at bedtime.   methocarbamol 500 MG tablet Commonly known as: ROBAXIN Take 1 tablet (500 mg total) by mouth every 6 (six) hours as needed for muscle spasms.   Mirena (52 MG) 20 MCG/DAY Iud Generic drug: levonorgestrel 1 each by Intrauterine route once.   Multi-Vitamin tablet Take 1 tablet by mouth daily.   naproxen 500 MG tablet Commonly known as: NAPROSYN Take 1 tablet (500 mg total) by mouth 2 (two) times daily with a meal.   ondansetron 4 MG disintegrating tablet Commonly known as: ZOFRAN-ODT Take 4 mg by mouth every 6 (six) hours as needed for nausea or vomiting.   ondansetron 4 MG tablet Commonly known as: ZOFRAN Take 1 tablet (4 mg total) by mouth every 6 (six) hours as needed for nausea.   oxyCODONE 5 MG immediate release tablet Commonly known as: Oxy IR/ROXICODONE Take 1-2 tablets (5-10 mg total) by mouth every 4 (four) hours as needed for moderate pain (pain score 4-6).   potassium chloride SA 20 MEQ tablet Commonly known as: KLOR-CON M Take 20 mEq by mouth daily.   Ubrelvy 100 MG Tabs Generic drug: Ubrogepant Take 1 tablet by mouth daily as needed.        Allergies:  Allergies  Allergen Reactions   Triptans     Itchy throat (migraine medications)   Amoxicillin Other (See Comments)    Scratchy throat    Family History: Family History  Problem Relation Age of Onset   Diabetes Mother    Seizures Paternal Aunt    Cancer Maternal Grandmother    Stroke Paternal Grandmother    Breast cancer Neg Hx    Ovarian cancer Neg Hx    Colon cancer Neg Hx     Social History:  reports that she has never smoked. She has never used smokeless tobacco. She reports current alcohol use. She reports that she does not currently use drugs.  ROS:                                         Physical Exam: There were no vitals taken for this visit.  Constitutional:  Alert and oriented, No acute distress. HEENT: [place] AT, moist mucus membranes.  Trachea midline, no masses. Cardiovascular: No clubbing, cyanosis, or edema. Respiratory: Normal respiratory effort, no increased work of breathing. GI: Abdomen is soft, nontender, nondistended, no abdominal masses GU: No prolapse or stress incontinence. Skin: No rashes,  bruises or suspicious lesions. Lymph: No cervical or inguinal adenopathy. Neurologic: Grossly intact, no focal deficits, moving all 4 extremities. Psychiatric: Normal mood and affect.  Laboratory Data: No results found for: "WBC", "HGB", "HCT", "MCV", "PLT"  Lab Results  Component Value Date   CREATININE 0.75 06/29/2018    No results found for: "PSA"  No results found for: "TESTOSTERONE"  No results found for: "HGBA1C"  Urinalysis No results found for: "COLORURINE", "APPEARANCEUR", "LABSPEC", "PHURINE", "GLUCOSEU", "HGBUR", "BILIRUBINUR", "KETONESUR", "PROTEINUR", "UROBILINOGEN", "NITRITE", "LEUKOCYTESUR"  Pertinent Imaging: No blood in urine.  Urine sent for culture.  Chart reviewed  Assessment & Plan: Patient is having significant flow symptoms.  Pathophysiology discussed.  Residual today was 22 mL.  I spoke to the patient who appears to be getting care in a number of places but primarily at [facility].  I went through a lot of her notes trying to piece things together.  She already is seeing pelvic floor therapy at [facility] and will continue to do this.  Put her on Flomax recognizing limitations and we will see her back in about 3 months.  Cystoscopy was normal documented during her hysterectomy by her surgeon.  I do not think there was enough proved yet to put her on prophylaxis for recurrent UTIs.  I did note that it becomes more complicated when someone is getting care from multiple providers.  If she gets more culture proven  in the future I likely will put her on prophylaxis.  There are no diagnoses linked to this encounter.  No follow-ups on file.  Reece Packer, MD  [facility] [address], [place], [place] [address] [phone]

## 2022-05-01 LAB — CULTURE, URINE COMPREHENSIVE

## 2022-05-04 ENCOUNTER — Encounter: Payer: Self-pay | Admitting: Urology

## 2022-05-05 ENCOUNTER — Other Ambulatory Visit: Payer: Self-pay

## 2022-05-05 MED ORDER — FLUCONAZOLE 100 MG PO TABS: 100.0000 mg | ORAL_TABLET | Freq: Every day | ORAL | 0 refills | Status: AC

## 2022-06-01 ENCOUNTER — Other Ambulatory Visit: Payer: Self-pay | Admitting: Orthopedic Surgery

## 2022-06-01 DIAGNOSIS — M4317 Spondylolisthesis, lumbosacral region: Secondary | ICD-10-CM

## 2022-06-10 ENCOUNTER — Ambulatory Visit
Admission: RE | Admit: 2022-06-10 | Discharge: 2022-06-10 | Disposition: A | Discharge status: Home / Self Care | Payer: 59 | Source: Ambulatory Visit | Attending: Orthopedic Surgery | Admitting: Orthopedic Surgery

## 2022-06-10 DIAGNOSIS — M4317 Spondylolisthesis, lumbosacral region: Secondary | ICD-10-CM

## 2022-06-14 ENCOUNTER — Ambulatory Visit
Admission: RE | Admit: 2022-06-14 | Discharge: 2022-06-14 | Disposition: A | Discharge status: Home / Self Care | Payer: 59 | Source: Ambulatory Visit | Attending: Orthopedic Surgery | Admitting: Orthopedic Surgery

## 2022-06-14 DIAGNOSIS — M4317 Spondylolisthesis, lumbosacral region: Secondary | ICD-10-CM | POA: Diagnosis present

## 2022-07-25 ENCOUNTER — Encounter: Payer: Self-pay | Admitting: Urology

## 2022-07-26 ENCOUNTER — Ambulatory Visit: Payer: 59 | Admitting: Urology

## 2022-09-28 ENCOUNTER — Encounter: Payer: Self-pay | Admitting: Neurosurgery

## 2022-09-29 ENCOUNTER — Inpatient Hospital Stay
Admission: RE | Admit: 2022-09-29 | Discharge: 2022-09-29 | Disposition: A | Discharge status: Home / Self Care | Payer: Self-pay | Source: Ambulatory Visit | Attending: Neurosurgery | Admitting: Neurosurgery

## 2022-09-29 ENCOUNTER — Other Ambulatory Visit: Payer: Self-pay

## 2022-09-29 DIAGNOSIS — Z049 Encounter for examination and observation for unspecified reason: Secondary | ICD-10-CM

## 2022-09-29 NOTE — Progress Notes (Unsigned)
Referring Physician:  [facility], [place],  [place] [address]  Primary Physician:  Leonel Ramsay, MD  History of Present Illness: 09/30/2022 Amber Reed is here today with a chief complaint of low back pain with radiation into the left leg to the foot with numbness in the leg as well. She also complains of numbness in the genital area.  She has been having the symptoms since prior to her surgery December 2022, but her symptoms have worsened since early 2023.  She now has irritating burning down her left leg down the back of her leg.  This bothers her particularly at night.  She has some trouble going to sleep because of this.  Sitting, lying down, seems to make her pain worse.  She also reports difficulty with initiating urination.  She has been evaluated by urologist who did not find any obvious cause.  She has difficulty with sensation particular during intercourse.  Nothing is really helped.  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   Bowel/Bladder Dysfunction: none  Conservative measures: EMG on 09/16/22 Physical therapy: doing physical therapy at [facility] for pelvic floor and also focusing some on the back and leg as well.   Multimodal medical therapy including regular antiinflammatories:  tramadol, robaxin, tizanidine, gabapentin, lyrica Injections: has received epidural steroid injections 08/04/2022: Left L5-S1 transforaminal ESI (Celestone 12 mg no relief) 06/29/2022: Left L5-S1 transforaminal ESI (1 week of good relief then return of pain) 06/18/2022: Left piriformis injection (mild to moderate relief) 03/22/2022: Left hip arthroscopy by Dr. Posey Pronto 06/11/2021: L5-S1 posterior lateral fusion performed by Dr. Lacinda Axon 09/18/2019: Bilateral L5 pars defects injections 12/28/2018: L2-3 interlaminar ESI  (Dr. Consuela Mimes) 09/28/2018: L2-3 interlaminar ESI (Dr. Consuela Mimes) 02/16/2018: Bilateral L5-S1 transforaminal ESI 01/17/2018: Bilateral L5-S1 transforaminal ESI (moderate relief)   Past Surgery:  L5-S1 fusion on 06/11/2021 by Dr. Darl Pikes has no symptoms of cervical myelopathy.  The symptoms are causing a significant impact on the patient's life.   I have utilized the care everywhere function in epic to review the outside records available from external health systems.  Review of Systems:  A 10 point review of systems is negative, except for the pertinent positives and negatives detailed in the HPI.  Past Medical History: Past Medical History:  Diagnosis Date   Anemia    Anxiety    Endometriosis    Factor 5 Leiden mutation, heterozygous ([place])    Meniere disease    Migraine    Osteoarthritis of left hip    PCO (polycystic ovaries)    PONV (postoperative nausea and vomiting)    Shingles outbreak 09/2018   Spondylosis    SVT (supraventricular tachycardia) 2014   Thyroiditis     Past Surgical History: Past Surgical History:  Procedure Laterality Date   ABDOMINOPLASTY  2008   CARDIAC ELECTROPHYSIOLOGY STUDY AND ABLATION  2014   CESAREAN SECTION  2006   HERNIA REPAIR  [id number]   umbilical   HIP ARTHROSCOPY Left 03/22/2022   Procedure: Left hip arthroscopy, acetabuloplasty, labral repair, femoral osteochondroplasty, capsular closure;  Surgeon: Leim Fabry, MD;  Location: [facility] ORS;  Service: Orthopedics;  Laterality: Left;   LIPOSUCTION TRUNK  2021   MASTOPEXY Bilateral 2022   NASAL SINUS SURGERY  2003   PELVIC LAPAROSCOPY  2018   endometriosis adhesions   POSTERIOR LUMBAR FUSION  06/11/2021   L5-S1    Allergies: Allergies as of 09/30/2022 - Review Complete 09/30/2022  Allergen Reaction Noted   Amoxicillin Other (See Comments) and Itching 08/31/2011   Linaclotide  04/06/2021   Triptans  09/08/2018    Medications: Current Meds  Medication Sig   AJOVY 225  MG/1.5ML SOSY Inject 1 Dose into the skin every 30 (thirty) days.   clonazePAM (KLONOPIN) 0.5 MG tablet Take 0.5 mg by mouth daily as needed.   Cyanocobalamin-Methylcobalamin 600-600 MCG SUBL Place 1 tablet under the tongue daily.   hydrochlorothiazide (HYDRODIURIL) 25 MG tablet Take 25 mg by mouth daily.   ibuprofen (ADVIL) 600 MG tablet Take 1,200 mg by mouth 2 (two) times daily.   meclizine (ANTIVERT) 25 MG tablet Take 1 tablet by mouth as needed.   methimazole (TAPAZOLE) 10 MG tablet Take 10 mg by mouth daily.   Multiple Vitamin (MULTI-VITAMIN) tablet Take 1 tablet by mouth daily.   ondansetron (ZOFRAN) 4 MG tablet Take 1 tablet (4 mg total) by mouth every 6 (six) hours as needed for nausea.   potassium chloride SA (K-DUR,KLOR-CON) 20 MEQ tablet Take 20 mEq by mouth daily.   progesterone (PROMETRIUM) 100 MG capsule Take 100 mg by mouth at bedtime.   UBRELVY 100 MG TABS Take 1 tablet by mouth daily as needed.    Social History: Social History   Tobacco Use   Smoking status: Never   Smokeless tobacco: Never  Vaping Use   Vaping Use: Never used  Substance Use Topics   Alcohol use: Yes    Comment: rarely   Drug use: Not Currently    Family Medical History: Family History  Problem Relation Age of Onset   Diabetes Mother    Seizures Paternal Aunt    Cancer Maternal Grandmother    Stroke Paternal Grandmother    Breast cancer Neg Hx    Ovarian cancer Neg Hx    Colon cancer Neg Hx     Physical Examination: Vitals:   09/30/22 1403  BP: 118/78    General: Patient is well developed, well nourished, calm, collected, and in no apparent distress. Attention to examination is appropriate.  Neck:   Supple.  Full range of motion.  Respiratory: Patient is breathing without any difficulty.   NEUROLOGICAL:     Awake, alert, oriented to person, place, and time.  Speech is clear and fluent.   Cranial Nerves: Pupils equal round and reactive to light.  Facial tone is symmetric.   Facial sensation is symmetric. Shoulder shrug is symmetric. Tongue protrusion is midline.  There is no pronator drift.    Strength: Side Biceps Triceps Deltoid Interossei Grip Wrist Ext. Wrist Flex.  R 5 5 5 5 5 5 5   L 5 5 5 5 5 5 5    Side Iliopsoas Quads Hamstring PF DF EHL  R 5 5 5 5 5 5   L 5 5 5 5 5 5    Reflexes are 1+ and symmetric at the biceps, triceps, brachioradialis, patella and achilles.   Hoffman's is absent.   Bilateral upper and lower extremity sensation is intact to light touch.    No evidence of dysmetria noted.  Gait is normal.     Medical Decision Making  Imaging: MRI L spine 06/15/2022 IMPRESSION: 1. Stable appearance of the lumbar spine as compared to 11/22/2021. 2. 3 mm anterolisthesis of L5 on S1 with associated disc bulge and facet hypertrophy, resulting in mild to moderate left greater than right L5 foraminal stenosis. 3.  Small central disc protrusions at L3-4 and L4-5 without significant stenosis or neural impingement.     Electronically Signed   By: Jeannine Boga M.D.   On: 06/15/2022 01:45   I have personally reviewed the images and agree with the above interpretation.  EMG 09/16/2022 Impression: This is an abnormal electrodiagnostic exam consistent with a 1) chronic S1 radiculopathy on the left. 2) Lumbar paraspinal fibs can be post surgical changes.   Thank you for the referral of this patient. It was our privilege to participate in care of your patient. Feel free to contact us with any further questions.  _____________________________ Jennings Books, M.D.   Assessment and Plan: Amber Reed is a pleasant 42 y.o. female with history of L5-S1 surgery in 2022.  She is having ongoing symptoms consistent with a left S1 radiculopathy that is chronic in nature.  She did describe some subjective and objective difference in temperature sensation in her left lower leg.  She denied swelling, but has had some paleness in that lower limb.  That raises  the consideration of complex regional pain syndrome.  I do not have a good explanation on her MRI scan for the surgical lesion.  I would like to see what her MRI of the sacrum shows next week.  We discussed that spinal cord stimulation is a consideration for her irrespective of what is seen on her MRI scan next week.  After full discussion of this, she was interested in considering this.  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  I did report to her that I did not know much about this condition, but would be happy to order the appropriate lab test as she has seen the lab test.  If she is felt to be a good candidate for spinal cord stimulator after trial, I will be happy to place a stimulator for her.     Wil Slape K. Izora Ribas MD, [facility]

## 2022-09-30 ENCOUNTER — Encounter: Payer: Self-pay | Admitting: Neurosurgery

## 2022-09-30 ENCOUNTER — Ambulatory Visit: Payer: 59 | Admitting: Neurosurgery

## 2022-09-30 VITALS — BP 118/78 | Ht 71.0 in | Wt 150.0 lb

## 2022-09-30 DIAGNOSIS — R200 Anesthesia of skin: Secondary | ICD-10-CM

## 2022-09-30 DIAGNOSIS — K5900 Constipation, unspecified: Secondary | ICD-10-CM

## 2022-09-30 DIAGNOSIS — M961 Postlaminectomy syndrome, not elsewhere classified: Secondary | ICD-10-CM

## 2022-09-30 NOTE — Patient Instructions (Signed)
Dr Rodenbough in [place] [phone] Dr Adam McDermott in [place] - also does televisits [phone] Dr Mount in [place] [phone] Dr Jeffrey Feldman in [place] - also does televisits [phone] [facility] (offices in [place], [place], and [place]) [phone] [facility] - televisits [phone] [facility] - televisits [phone]   Please let us know which one of the above psychologist's you would like to see for evaluation prior to the spinal cord stimulator trial. These are the only providers we are aware of that perform this type of evaluation. Once we fax the referral, you are required to call them to set up an appointment (they will not call you).  

## 2022-10-07 ENCOUNTER — Inpatient Hospital Stay
Admission: RE | Admit: 2022-10-07 | Discharge: 2022-10-07 | Disposition: A | Discharge status: Home / Self Care | Payer: Self-pay | Source: Ambulatory Visit | Attending: Neurosurgery | Admitting: Neurosurgery

## 2022-10-07 ENCOUNTER — Ambulatory Visit: Payer: 59 | Admitting: Neurosurgery

## 2022-10-07 ENCOUNTER — Other Ambulatory Visit: Payer: Self-pay

## 2022-10-07 DIAGNOSIS — Z049 Encounter for examination and observation for unspecified reason: Secondary | ICD-10-CM

## 2022-11-11 ENCOUNTER — Ambulatory Visit: Payer: 59 | Admitting: Student in an Organized Health Care Education/Training Program

## 2024-04-12 ENCOUNTER — Other Ambulatory Visit: Payer: Self-pay | Admitting: Sports Medicine

## 2024-04-12 DIAGNOSIS — M4802 Spinal stenosis, cervical region: Secondary | ICD-10-CM

## 2024-04-12 DIAGNOSIS — M542 Cervicalgia: Secondary | ICD-10-CM

## 2024-04-12 DIAGNOSIS — M62838 Other muscle spasm: Secondary | ICD-10-CM

## 2024-04-12 DIAGNOSIS — M5412 Radiculopathy, cervical region: Secondary | ICD-10-CM

## 2024-04-14 ENCOUNTER — Ambulatory Visit
Admission: RE | Admit: 2024-04-14 | Discharge: 2024-04-14 | Disposition: A | Discharge status: Home / Self Care | Source: Ambulatory Visit | Attending: Sports Medicine | Admitting: Sports Medicine

## 2024-04-14 DIAGNOSIS — M4802 Spinal stenosis, cervical region: Secondary | ICD-10-CM | POA: Diagnosis present

## 2024-04-14 DIAGNOSIS — M62838 Other muscle spasm: Secondary | ICD-10-CM | POA: Diagnosis present

## 2024-04-14 DIAGNOSIS — M542 Cervicalgia: Secondary | ICD-10-CM | POA: Insufficient documentation

## 2024-04-14 DIAGNOSIS — M5412 Radiculopathy, cervical region: Secondary | ICD-10-CM | POA: Insufficient documentation
